# Patient Record
Sex: Female | Born: 1975 | Race: White | Hispanic: No | Marital: Married | Smoking: Never smoker
Health system: Southern US, Community
[De-identification: ages and names within clinical notes are randomized; demographics above are authoritative.]

## PROBLEM LIST (undated history)

## (undated) DIAGNOSIS — E7800 Pure hypercholesterolemia, unspecified: Secondary | ICD-10-CM

## (undated) DIAGNOSIS — F329 Major depressive disorder, single episode, unspecified: Secondary | ICD-10-CM

## (undated) DIAGNOSIS — F3289 Other specified depressive episodes: Secondary | ICD-10-CM

## (undated) DIAGNOSIS — R32 Unspecified urinary incontinence: Secondary | ICD-10-CM

## (undated) DIAGNOSIS — R030 Elevated blood-pressure reading, without diagnosis of hypertension: Secondary | ICD-10-CM

## (undated) DIAGNOSIS — G43009 Migraine without aura, not intractable, without status migrainosus: Secondary | ICD-10-CM

## (undated) DIAGNOSIS — I10 Essential (primary) hypertension: Secondary | ICD-10-CM

## (undated) HISTORY — DX: Unspecified urinary incontinence: R32

## (undated) HISTORY — DX: Migraine without aura, not intractable, without status migrainosus: G43.009

## (undated) HISTORY — PX: ABDOMINAL HYSTERECTOMY: SHX81

## (undated) HISTORY — DX: Other specified depressive episodes: F32.89

## (undated) HISTORY — DX: Major depressive disorder, single episode, unspecified: F32.9

## (undated) HISTORY — DX: Pure hypercholesterolemia, unspecified: E78.00

## (undated) HISTORY — PX: OOPHORECTOMY: SHX86

## (undated) HISTORY — DX: Elevated blood-pressure reading, without diagnosis of hypertension: R03.0

## (undated) HISTORY — PX: TONSILLECTOMY AND ADENOIDECTOMY: SHX28

---

## 2001-05-04 ENCOUNTER — Other Ambulatory Visit: Admission: RE | Admit: 2001-05-04 | Discharge: 2001-05-04 | Payer: Self-pay | Admitting: Obstetrics & Gynecology

## 2001-05-07 ENCOUNTER — Encounter: Payer: Self-pay | Admitting: Obstetrics & Gynecology

## 2001-05-07 ENCOUNTER — Encounter: Admission: RE | Admit: 2001-05-07 | Discharge: 2001-05-07 | Payer: Self-pay | Admitting: Obstetrics & Gynecology

## 2006-05-28 ENCOUNTER — Ambulatory Visit: Payer: Self-pay | Admitting: Unknown Physician Specialty

## 2006-09-15 ENCOUNTER — Inpatient Hospital Stay: Payer: Self-pay | Admitting: Unknown Physician Specialty

## 2009-09-03 ENCOUNTER — Encounter: Payer: Self-pay | Admitting: Family Medicine

## 2010-01-01 LAB — HM PAP SMEAR

## 2010-01-01 LAB — CONVERTED CEMR LAB
Pap Smear: NORMAL
Pap Smear: NORMAL
Pap Smear: NORMAL

## 2010-08-09 ENCOUNTER — Ambulatory Visit: Payer: Self-pay | Admitting: Family Medicine

## 2010-08-09 DIAGNOSIS — G43009 Migraine without aura, not intractable, without status migrainosus: Secondary | ICD-10-CM

## 2010-08-09 DIAGNOSIS — F329 Major depressive disorder, single episode, unspecified: Secondary | ICD-10-CM

## 2010-08-09 DIAGNOSIS — F3289 Other specified depressive episodes: Secondary | ICD-10-CM | POA: Insufficient documentation

## 2010-08-09 DIAGNOSIS — G43109 Migraine with aura, not intractable, without status migrainosus: Secondary | ICD-10-CM | POA: Insufficient documentation

## 2010-08-09 DIAGNOSIS — E7800 Pure hypercholesterolemia, unspecified: Secondary | ICD-10-CM | POA: Insufficient documentation

## 2010-08-09 DIAGNOSIS — R030 Elevated blood-pressure reading, without diagnosis of hypertension: Secondary | ICD-10-CM | POA: Insufficient documentation

## 2010-08-09 DIAGNOSIS — R32 Unspecified urinary incontinence: Secondary | ICD-10-CM | POA: Insufficient documentation

## 2010-08-09 DIAGNOSIS — I10 Essential (primary) hypertension: Secondary | ICD-10-CM | POA: Insufficient documentation

## 2010-08-09 DIAGNOSIS — J4520 Mild intermittent asthma, uncomplicated: Secondary | ICD-10-CM | POA: Insufficient documentation

## 2010-08-09 DIAGNOSIS — K219 Gastro-esophageal reflux disease without esophagitis: Secondary | ICD-10-CM | POA: Insufficient documentation

## 2010-09-11 ENCOUNTER — Encounter: Payer: Self-pay | Admitting: Family Medicine

## 2010-10-10 ENCOUNTER — Ambulatory Visit: Payer: Self-pay | Admitting: Family Medicine

## 2010-10-15 ENCOUNTER — Ambulatory Visit: Payer: Self-pay | Admitting: Family Medicine

## 2010-10-15 DIAGNOSIS — E039 Hypothyroidism, unspecified: Secondary | ICD-10-CM | POA: Insufficient documentation

## 2010-12-03 NOTE — Assessment & Plan Note (Signed)
Summary: URI   Vital Signs:  Patient profile:   34 year old female Height:      67 inches Weight:      154.19 pounds BMI:     24.24 Temp:     98.1 degrees F oral Pulse rate:   64 / minute Pulse rhythm:   regular BP sitting:   120 / 80  (left arm) Cuff size:   regular  Vitals Entered By: Linde Gillis CMA Duncan Dull) (October 10, 2010 2:58 PM) CC: ? URI   History of Present Illness: 35 yo here for URI symptoms.  5 days ago, started with runny nose, dry cough. She is an asthmatic but has not had to use her inhalers. No fevers or chills. Husband had similar symptoms last week and was treated for sinus infection but her sinuses are not bothering her.  Her main complaint is sore throat and dry cough. Taking Mucinex with some relief of symptoms.    Current Medications (verified): 1)  Prilosec 40 Mg Cpdr (Omeprazole) .... One Tablet Daily 2)  Nasonex 50 Mcg/act Susp (Mometasone Furoate) .Marland Kitchen.. 1 Spray Each Nostril Daily 3)  Imitrex 100 Mg Tabs (Sumatriptan Succinate) .... As Needed 4)  Ventolin Hfa 108 (90 Base) Mcg/act Aers (Albuterol Sulfate) .... 2 Puffs Inhaled Every 4-6 Hours As Needed Wheeze 5)  Vivelle-Dot 0.05 Mg/24hr Pttw (Estradiol) .... One Patch Daily 6)  Ketoconazole 2 % Crea (Ketoconazole) .... As Needed 7)  Hydrocortisone 2.5 % Crea (Hydrocortisone) .... As Needed 8)  Multivitamins  Caps (Multiple Vitamin) .... One Tablet Daily  Allergies (verified): No Known Drug Allergies  Past History:  Past Medical History: Last updated: 08/09/2010 Current Problems:  URINARY INCONTINENCE (ICD-788.30) HYPERCHOLESTEROLEMIA (ICD-272.0) ELEVATED BP READING WITHOUT DX HYPERTENSION (ICD-796.2) COMMON MIGRAINE (ICD-346.10) DEPRESSION (ICD-311)    Past Surgical History: Last updated: 08/09/2010 tonsils and adenoids 1993 or 1994 Hysterectomy, total 2007  Family History: Last updated: 08/09/2010 Family History of Arthritis Family History of Colon CA 1st degree relative  <60 Family History Depression Family History High cholesterol Family History Hypertension Family History Kidney disease Family History of Stroke F 1st degree relative <60 Family History of Stroke M 1st degree relative <50 Family History of Sudden Death Family History of Lupus  Mother: depression, HTN, lupus, Sjogren's Father: died from cerebral anyrsym, HTN, GERD only child  Social History: Last updated: 08/09/2010 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 ENT:  Complains of nasal congestion and sore throat; denies sinus pressure. CV:  Denies chest pain or discomfort. Resp:  Complains of cough; denies shortness of breath, sputum productive, and wheezing.  Physical Exam  General:  Well-developed,well-nourished,in no acute distress; alert,appropriate and cooperative throughout examination VSS, nontoxic appearing Nose:  external erythema and mucosal erythema.   sinuses NTTP. Mouth:  pharyngeal erythema.   Neck:  No deformities, masses, or tenderness noted. Lungs:  Normal respiratory effort, chest expands symmetrically. Lungs are clear to auscultation, no crackles or wheezes. Heart:  Normal rate and regular rhythm. S1 and S2 normal without gallop, murmur, click, rub or other extra sounds. Psych:  Cognition and judgment appear intact. Alert and cooperative with normal attention span and concentration. No apparent delusions, illusions, hallucinations   Impression & Recommendations:  Problem # 1:  URI (ICD-465.9)  Assessment New Likely viral. Although she has a h/o asthma, Vital signs and physical exam unremarkable. Advised to continue supportive care as per pt instructions.  Complete Medication List: 1)  Prilosec 40 Mg  Cpdr (Omeprazole) .... One tablet daily 2)  Nasonex 50 Mcg/act Susp (Mometasone furoate) .Marland Kitchen.. 1 spray each nostril daily 3)  Imitrex 100 Mg Tabs (Sumatriptan succinate) .... As needed 4)  Ventolin Hfa 108 (90 Base) Mcg/act Aers (Albuterol sulfate) .... 2 puffs inhaled every 4-6 hours as needed wheeze 5)  Vivelle-dot 0.05 Mg/24hr Pttw (Estradiol) .... One patch daily 6)  Ketoconazole 2 % Crea (Ketoconazole) .... As needed 7)  Hydrocortisone 2.5 % Crea (Hydrocortisone) .... As needed 8)  Multivitamins Caps (Multiple vitamin) .... One tablet daily  Patient Instructions: 1)  Get plenty of rest, drink lots of clear liquids, and use Tylenol or Ibuprofen for fever and comfort. Return in 7-10 days if you're not better: sooner if you'er feeling worse.    Orders Added: 1)  Est. Patient Level III [[address]]    Current Allergies (reviewed today): No known allergies

## 2010-12-03 NOTE — Assessment & Plan Note (Signed)
Summary: NEW PT TO EST/CPX/CLE   Vital Signs:  Patient profile:   35 year old female Height:      67 inches Weight:      155.0 pounds BMI:     24.36 Temp:     97.9 degrees F oral Pulse rate:   80 / minute Pulse rhythm:   regular BP sitting:   100 / 60  (left arm) Cuff size:   regular  Vitals Entered By: Benny Lennert CMA Duncan Dull) (August 09, 2010 10:06 AM)   History of Present Illness: Here to establish..previously seen Dr. Arlana Pouch.  Sees GYN..Dr. Arvil Chaco...had total hysterectomy for endometriosis, bleeding in ovaries...concerning for early ovarian cancer. HAd precancerous cells on cervix..cryotherapy.   Asthma and allergic rhinitis well controlled on nasonex.Rarely uses proventil..needs refill it is expired.  Migraine..1 every 3 months..knows triggers and can avoid.  Uses imitrex as needed.   GERD, well controlled on omeprazole.Marland KitchenMarland KitchenShe has lost 100 over last 2 years. Open to trying to stop.   Elevvated Bps in past when was obese...but resolved with eweight loss. On no medicaiton now.   High chol improved with weight loss as well. Last check at health [facility]. Has upcoming health [facility] in 09/2010  Preventive Screening-Counseling & Management  Alcohol-Tobacco     Smoking Status: never  Caffeine-Diet-Exercise     Does Patient Exercise: yes      Drug Use:  no.    Allergies (verified): No Known Drug Allergies  Past History:  Past Medical History: Current Problems:  URINARY INCONTINENCE (ICD-788.30) HYPERCHOLESTEROLEMIA (ICD-272.0) ELEVATED BP READING WITHOUT DX HYPERTENSION (ICD-796.2) COMMON MIGRAINE (ICD-346.10) DEPRESSION (ICD-311)    Past Surgical History: tonsils and adenoids 1993 or 1994 Hysterectomy, total 2007  Family History: Family History of Arthritis Family History of Colon CA 1st degree relative <60 Family History Depression Family History High cholesterol Family History Hypertension Family History Kidney disease Family History of Stroke F 1st  degree relative <60 Family History of Stroke M 1st degree relative <50 Family History of Sudden Death Family History of Lupus  Mother: depression, HTN, lupus, Sjogren's Father: died from cerebral anyrsym, HTN, GERD only child  Social History: 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 CV:  Denies chest pain or discomfort. Resp:  Denies shortness of breath. GI:  Complains of abdominal pain; denies bloody stools, constipation, and diarrhea; intermittant right lower abdominal pain.Marland KitchenGYN did [place]..nml..thought likely due to adhesions...nml bowel habits. GU:  Denies dysuria.   Impression & Recommendations:  Problem # 1:  GASTROESOPHAGEAL REFLUX DISEASE (ICD-530.81) Gradually wean off omeprazole...go down to 20 mg prilosec x several weeks then every pother day, then every 3 days then off.  Her updated medication list for this problem includes:    Prilosec 40 Mg Cpdr (Omeprazole) ..... One tablet daily  Problem # 2:  ASTHMA, INTERMITTENT, MILD (ICD-493.90) Well controlle don allergy meds, no controller. Her updated medication list for this problem includes:    Ventolin Hfa 108 (90 Base) Mcg/act Aers (Albuterol sulfate) .Marland Kitchen... 2 puffs inhaled every 4-6 hours as needed wheeze  Problem # 3:  ELEVATED BP READING WITHOUT DX HYPERTENSION (ICD-796.2) Resolved with 100 lb weight loss. BP today: 100/60  Instructed in low sodium diet (DASH Handout) and behavior modification.    Problem # 4:  HYPERCHOLESTEROLEMIA (ICD-272.0) She will fax [place] lab results from [facility] in 09/2010.  Complete Medication List: 1)  Prilosec 40 Mg Cpdr (Omeprazole) .... One tablet daily 2)  Nasonex 50 Mcg/act Susp (Mometasone furoate) .Marland Kitchen.. 1 spray each nostril daily 3)   Imitrex 100 Mg Tabs (Sumatriptan succinate) .... As needed 4)  Ventolin Hfa 108 (90 Base) Mcg/act Aers (Albuterol sulfate) .... 2 puffs inhaled every 4-6 hours as needed wheeze 5)  Vivelle-dot 0.05 Mg/24hr Pttw (Estradiol) .... One patch daily 6)  Ketoconazole 2 % Crea (Ketoconazole) .... As needed 7)  Hydrocortisone 2.5 % Crea (Hydrocortisone) .... As needed 8)  Multivitamins Caps (Multiple vitamin) .... One tablet daily  Other Orders: Tdap => 51yrs IM [phone]) Admin 1st Vaccine (52841) Admin 1st Vaccine [facility]) [phone])  Patient Instructions: 1)  Gradually wean off omeprazole...go down to 20 mg prilosec x several weeks then every pother day, then every 3 days then off.  2)  Fax results of labs from [facility]. 3)  Please schedule a follow-up appointment in 1 year or earlier if needed.  Prescriptions: NASONEX 50 MCG/ACT SUSP (MOMETASONE FUROATE) 1 spray each nostril daily  #30 x 11   Entered and Authorized by:   Kerby Nora MD   Signed by:   Kerby Nora MD on 08/09/2010   Method used:   Electronically to        [id number] (ret[facility])       [address]       [place], [place]  [address]       Ph: [phone]       Fax: [phone]   RxID:   [id number] VENTOLIN HFA 108 (90 BASE) MCG/ACT AERS (ALBUTEROL SULFATE) 2 puffs inhaled every 4-6 hours as needed wheeze  #1 x 0   Entered and Authorized by:   Kerby Nora MD   Signed by:   Kerby Nora MD on 08/09/2010   Method used:   Electronically to        [id number] (ret[facility])       [address]       [place], [place]  [address]       Ph: [phone]       Fax: [phone]   RxID:   [id number]   Current Allergies (reviewed today): No known allergies   Flu Vaccine Result Date:  09/03/2010 Flu Vaccine Result:  at work Flu Vaccine Next Due:  1 yr TD Result Date:  08/09/2010 TD Result:  given TD Next Due:  10 yr PAP Result Date:  01/01/2010 PAP Result:  normal PAP Next Due:  1  yr    Tetanus/Td Vaccine    Vaccine Type: Tdap    Site: left deltoid    Mfr: GlaxoSmithKline    Dose: 0.5 ml    Given by: Benny Lennert CMA (AAMA)    Exp. Date: 08/22/2012    Lot #: [id number]    VIS given: 09/20/08 version given August 09, 2010.

## 2010-12-05 NOTE — Assessment & Plan Note (Signed)
Summary: DISCUSS THYROID,REFLUX/CLE   Vital Signs:  Patient profile:   35 year old female Height:      67 inches Weight:      154.50 pounds BMI:     24.29 Temp:     97.8 degrees F oral Pulse rate:   72 / minute Pulse rhythm:   regular BP sitting:   100 / 60  (left arm) Cuff size:   regular  Vitals Entered By: Benny Lennert CMA Duncan Dull) (October 15, 2010 10:47 AM)  History of Present Illness: Chief complaint discuss thyroid and reflux  GERD: Was unable to stop prilosec entirely... symptoms controlled on 20 mg of prilosec daily. Has had some recent cough and mucus production x 1 week... saw Dr.a ron for viral infection.  Hoarse at night.  Had recetn elevated TSH but normal T4...subclinical hypothyroidism.  Hands and feet are very cold x 6 months, hair brittle breaking a lot...noted in last 3 months. Fairly good energy.. some decrease.  Problems Prior to Update: 1)  Uri  (ICD-465.9) 2)  Dermatitis, Seborrheic  (ICD-690.10) 3)  Gastroesophageal Reflux Disease  (ICD-530.81) 4)  Asthma, Intermittent, Mild  (ICD-493.90) 5)  Family History Depression  (ICD-V17.0) 6)  Family History of Colon Ca 1st Degree Relative <60  (ICD-V16.0) 7)  Urinary Incontinence  (ICD-788.30) 8)  Hypercholesterolemia  (ICD-272.0) 9)  Elevated Bp Reading Without Dx Hypertension  (ICD-796.2) 10)  Common Migraine  (ICD-346.10) 11)  Hx of Depression  (ICD-311)  Current Medications (verified): 1)  Prilosec 20 Mg Cpdr (Omeprazole) .... One Tablet Daily 2)  Nasonex 50 Mcg/act Susp (Mometasone Furoate) .Marland Kitchen.. 1 Spray Each Nostril Daily 3)  Imitrex 100 Mg Tabs (Sumatriptan Succinate) .... As Needed 4)  Ventolin Hfa 108 (90 Base) Mcg/act Aers (Albuterol Sulfate) .... 2 Puffs Inhaled Every 4-6 Hours As Needed Wheeze 5)  Vivelle-Dot 0.05 Mg/24hr Pttw (Estradiol) .... One Patch Daily 6)  Ketoconazole 2 % Crea (Ketoconazole) .... As Needed 7)  Hydrocortisone 2.5 % Crea (Hydrocortisone) .... As Needed 8)   Multivitamins  Caps (Multiple Vitamin) .... One Tablet Daily  Allergies (verified): No Known Drug Allergies  Past History:  Past medical, surgical, family and social histories (including risk factors) reviewed, and no changes noted (except as noted below).  Past Medical History: Reviewed history from 08/09/2010 and no changes required. Current Problems:  URINARY INCONTINENCE (ICD-788.30) HYPERCHOLESTEROLEMIA (ICD-272.0) ELEVATED BP READING WITHOUT DX HYPERTENSION (ICD-796.2) COMMON MIGRAINE (ICD-346.10) DEPRESSION (ICD-311)    Past Surgical History: Reviewed history from 08/09/2010 and no changes required. tonsils and adenoids 1993 or 1994 Hysterectomy, total 2007  Family History: Reviewed history from 08/09/2010 and no changes required. Family History of Arthritis Family History of Colon CA 1st degree relative <60 Family History Depression Family History High cholesterol Family History Hypertension Family History Kidney disease Family History of Stroke F 1st degree relative <60 Family History of Stroke M 1st degree relative <50 Family History of Sudden Death Family History of Lupus  Mother: depression, HTN, lupus, Sjogren's Father: died from cerebral anyrsym, HTN, GERD only child  Social History: Reviewed history from 08/09/2010 and no changes required. 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 CV:  Denies chest pain or discomfort. Resp:  Denies shortness of breath. GI:  Denies abdominal pain and constipation. GU:  Denies dysuria.  Physical Exam  General:  Well-developed,well-nourished,in no acute distress; alert,appropriate and  cooperative throughout examination Eyes:  No corneal or conjunctival inflammation noted. EOMI. Perrla. Funduscopic exam benign, without hemorrhages, exudates or  papilledema. Vision grossly normal. Ears:  External ear exam shows no significant lesions or deformities.  Otoscopic examination reveals clear canals, tympanic membranes are intact bilaterally without bulging, retraction, inflammation or discharge. Hearing is grossly normal bilaterally. Nose:  External nasal examination shows no deformity or inflammation. Nasal mucosa are pink and moist without lesions or exudates. Mouth:  Oral mucosa and oropharynx without lesions or exudates.  Teeth in good repair. Neck:  no carotid bruit or thyromegaly no cervical or supraclavicular lymphadenopathy  Lungs:  Normal respiratory effort, chest expands symmetrically. Lungs are clear to auscultation, no crackles or wheezes. Heart:  Normal rate and regular rhythm. S1 and S2 normal without gallop, murmur, click, rub or other extra sounds. Abdomen:  Bowel sounds positive,abdomen soft and non-tender without masses, organomegaly or hernias noted. Pulses:  R and L carotid,radial,femoral,dorsalis pedis and posterior tibial pulses are full and equal bilaterally Extremities:  no edema    Impression & Recommendations:  Problem # 1:  HYPOTHYROIDISM, BORDERLINE (ICD-244.9) Follow up closer than previously recommended given pt concer, family history and symptoms. Recheck in 4-5 months.   Problem # 2:  GASTROESOPHAGEAL REFLUX DISEASE (ICD-530.81) if ? viral symptoms not resolving...trial of prilosec back at 40 mg daily to determine if GERD is cause of symptoms.  Her updated medication list for this problem includes    Prilosec 20 Mg Cpdr (Omeprazole) ..... One tablet daily  Complete Medication List: 1)  Prilosec 20 Mg Cpdr (Omeprazole) .... One tablet daily 2)  Nasonex 50 Mcg/act Susp (Mometasone furoate) .Marland Kitchen.. 1 spray each nostril daily 3)  Imitrex 100 Mg Tabs (Sumatriptan succinate) .... As needed 4)  Ventolin Hfa 108 (90 Base) Mcg/act Aers (Albuterol sulfate) .... 2 puffs inhaled every 4-6 hours as needed wheeze 5)   Vivelle-dot 0.05 Mg/24hr Pttw (Estradiol) .... One patch daily 6)  Ketoconazole 2 % Crea (Ketoconazole) .... As needed 7)  Hydrocortisone 2.5 % Crea (Hydrocortisone) .... As needed 8)  Multivitamins Caps (Multiple vitamin) .... One tablet daily  Patient Instructions: 1)  If cough or hoarseness not improving with viral infection.Marland Kitchen try increasing for several days back to 40 mg daily. 2)  Return for free T3 and free  T4 , TSH Dx 780/79 in 4-5 months. 3)  Call sooner if fatigue or other symptoms worsening.    Orders Added: 1)  Est. Patient Level IV [[address]]    Current Allergies (reviewed today): No known allergies

## 2011-02-12 ENCOUNTER — Other Ambulatory Visit: Payer: Self-pay | Admitting: Family Medicine

## 2011-02-12 DIAGNOSIS — R5381 Other malaise: Secondary | ICD-10-CM

## 2011-02-12 DIAGNOSIS — R5383 Other fatigue: Secondary | ICD-10-CM

## 2011-02-17 ENCOUNTER — Other Ambulatory Visit (INDEPENDENT_AMBULATORY_CARE_PROVIDER_SITE_OTHER): Payer: 59 | Admitting: Family Medicine

## 2011-02-17 DIAGNOSIS — R5381 Other malaise: Secondary | ICD-10-CM

## 2011-02-17 DIAGNOSIS — R5383 Other fatigue: Secondary | ICD-10-CM

## 2011-02-17 LAB — T4, FREE: Free T4: 0.85 ng/dL (ref 0.60–1.60)

## 2011-02-17 LAB — T3, FREE: T3, Free: 2.8 pg/mL (ref 2.3–4.2)

## 2011-02-17 LAB — TSH: TSH: 4.54 u[IU]/mL (ref 0.35–5.50)

## 2011-03-13 ENCOUNTER — Other Ambulatory Visit: Payer: Self-pay | Admitting: Family Medicine

## 2011-06-30 ENCOUNTER — Telehealth: Payer: Self-pay | Admitting: *Deleted

## 2011-06-30 NOTE — Telephone Encounter (Signed)
Copy of labs from 4/16 mailed to patient's home address per her request.

## 2011-07-04 ENCOUNTER — Other Ambulatory Visit: Payer: Self-pay | Admitting: Family Medicine

## 2011-08-04 ENCOUNTER — Other Ambulatory Visit: Payer: Self-pay | Admitting: Family Medicine

## 2011-11-03 ENCOUNTER — Other Ambulatory Visit: Payer: Self-pay | Admitting: Family Medicine

## 2011-12-18 ENCOUNTER — Ambulatory Visit: Payer: 59 | Admitting: Family Medicine

## 2011-12-18 ENCOUNTER — Encounter: Payer: Self-pay | Admitting: Family Medicine

## 2011-12-18 ENCOUNTER — Ambulatory Visit (INDEPENDENT_AMBULATORY_CARE_PROVIDER_SITE_OTHER): Payer: 59 | Admitting: Family Medicine

## 2011-12-18 VITALS — BP 90/60 | HR 58 | Temp 99.0°F | Ht 67.0 in | Wt 173.1 lb

## 2011-12-18 DIAGNOSIS — H9209 Otalgia, unspecified ear: Secondary | ICD-10-CM

## 2011-12-18 DIAGNOSIS — H6690 Otitis media, unspecified, unspecified ear: Secondary | ICD-10-CM

## 2011-12-18 MED ORDER — FLUCONAZOLE 150 MG PO TABS: ORAL_TABLET | ORAL | Status: DC

## 2011-12-18 MED ORDER — AMOXICILLIN 500 MG PO CAPS: 1000.0000 mg | ORAL_CAPSULE | Freq: Two times a day (BID) | ORAL | Status: AC

## 2011-12-18 MED ORDER — ANTIPYRINE-BENZOCAINE 5.4-1.4 % OT SOLN: 3.0000 [drp] | OTIC | Status: AC | PRN

## 2011-12-18 NOTE — Progress Notes (Signed)
  Patient Name: Mary Keith Date of Birth: [birth year] Age: 36 y.o. Medical Record Number: [id number] Gender: female Date of Encounter: 12/18/2011  History of Present Illness:  Mary Keith is a 36 y.o. very pleasant female patient who presents with the following:  Left ear is really stopped up. Uncomfortable and a different sensation and pain in the posterior and on the inferior. Four weeks ago, went to alergy and asthma with a severe sinus infection. Tooksome predniosne and some ABX.  Ear was not bothering her then.   Past Medical History, Surgical History, Social History, Family History, Problem List, Medications, and Allergies have been reviewed and updated if relevant.  Review of Systems: ROS: GEN: Acute illness details above GI: Tolerating PO intake GU: maintaining adequate hydration and urination Pulm: No SOB Interactive and getting along well at home.  Otherwise, ROS is as per the HPI.   Physical Examination: Filed Vitals:   12/18/11 1034  BP: 90/60  Pulse: 58  Temp: 99 F (37.2 C)  TempSrc: Oral  Height: 5\' 7"  (1.702 m)  Weight: 173 lb 1.9 oz (78.527 kg)  SpO2: 100%    Body mass index is 27.11 kg/(m^2).   Gen: WDWN, NAD; A & O x3, cooperative. Pleasant.Globally Non-toxic HEENT: Normocephalic and atraumatic. Throat clear, w/o exudate, R TM clear, L TM - landmarks obscured, reddened. Some dulled light reflex. rhinnorhea.  MMM Frontal sinuses: NT Max sinuses: NT NECK: Anterior cervical  LAD is absent CV: RRR, No M/G/R, cap refill <2 sec PULM: Breathing comfortably in no respiratory distress. no wheezing, crackles, rhonchi EXT: No c/c/e PSYCH: Friendly, good eye contact MSK: Nml gait    Assessment and Plan: 1. Otitis media  amoxicillin (AMOXIL) 500 MG capsule, fluconazole (DIFLUCAN) 150 MG tablet, antipyrine-benzocaine (AURALGAN) otic solution  2. Ear pain      Auralgan now abx  Diflucan if needed

## 2012-01-24 ENCOUNTER — Other Ambulatory Visit: Payer: Self-pay | Admitting: Family Medicine

## 2012-02-25 ENCOUNTER — Other Ambulatory Visit: Payer: Self-pay | Admitting: Family Medicine

## 2012-03-18 ENCOUNTER — Ambulatory Visit: Payer: Self-pay | Admitting: Unknown Physician Specialty

## 2012-03-24 ENCOUNTER — Other Ambulatory Visit: Payer: Self-pay | Admitting: Family Medicine

## 2012-06-28 ENCOUNTER — Encounter: Payer: Self-pay | Admitting: Family Medicine

## 2012-06-28 ENCOUNTER — Ambulatory Visit (INDEPENDENT_AMBULATORY_CARE_PROVIDER_SITE_OTHER): Payer: 59 | Admitting: Family Medicine

## 2012-06-28 VITALS — BP 118/80 | Temp 98.3°F | Wt 177.0 lb

## 2012-06-28 DIAGNOSIS — R279 Unspecified lack of coordination: Secondary | ICD-10-CM

## 2012-06-28 DIAGNOSIS — M24819 Other specific joint derangements of unspecified shoulder, not elsewhere classified: Secondary | ICD-10-CM

## 2012-06-28 DIAGNOSIS — M24119 Other articular cartilage disorders, unspecified shoulder: Secondary | ICD-10-CM

## 2012-06-28 DIAGNOSIS — G2589 Other specified extrapyramidal and movement disorders: Secondary | ICD-10-CM

## 2012-06-28 DIAGNOSIS — M948X9 Other specified disorders of cartilage, unspecified sites: Secondary | ICD-10-CM

## 2012-06-28 NOTE — Patient Instructions (Addendum)
[email or web address] ALL SHOULDER VIDEOS AND WORKOUT DO 3-4 TIMES A WEEK   REFERRAL: GO THE THE FRONT ROOM AT THE ENTRANCE OF OUR CLINIC, NEAR CHECK IN. ASK FOR MARION. SHE WILL HELP YOU SET UP YOUR REFERRAL. DATE: TIME:    1. Snapping scapula syndrome   2. Scapular dyskinesis

## 2012-06-28 NOTE — Progress Notes (Signed)
[facility] at [facility] [address] [place] [address] Phone: [phone] Fax: [phone]  Date:  06/28/2012   Name:  Mary Keith   DOB:  01-Aug-1976   MRN:  [id number] Gender: female Age: 36 y.o.  PCP:  Kerby Nora, MD    Chief Complaint: left shoulder pain   History of Present Illness:  Mary Keith is a 36 y.o. very pleasant female patient who presents with the following:  Left posterior shoulder, pain and worse and worse and feels like stabbing a a knife under her shoulder blade. Nothing aggravates it. Nothing helps it more. Alleve will help a little  No pain with abduction, no neck pain, no radiculopathy, no tingling.  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  Current Outpatient Prescriptions on File Prior to Visit  Medication Sig Dispense Refill  . albuterol (PROVENTIL HFA;VENTOLIN HFA) 108 (90 BASE) MCG/ACT inhaler Inhale 2 puffs into the lungs every 6 (six) hours as needed.      Marland Kitchen NASONEX 50 MCG/ACT nasal spray INSTILL TWO SPRAYS IN EACH NOSTRIL EVERY DAY  51 g  1  . SUMAtriptan (IMITREX) 100 MG tablet TAKE 1 TABLET BY MOUTH AS NEEDED FOR MIGRAINE  9 tablet  2    Review of Systems:  GEN: No fevers, chills. Nontoxic. Primarily MSK c/o today. MSK: Detailed in the HPI GI: tolerating PO intake without difficulty Neuro: No numbness, parasthesias, or tingling associated. Otherwise the pertinent positives of the ROS are noted above.    Physical Examination: Filed Vitals:   06/28/12 1553  BP: 118/80  Temp: 98.3 F (36.8 C)   Filed Vitals:   06/28/12 1553  Weight: 177 lb (80.287 kg)   There is no height on file to calculate BMI. Ideal Body Weight:     GEN: WDWN, NAD, Non-toxic, Alert & Oriented x 3 HEENT: Atraumatic, Normocephalic.  Ears and Nose: No  external deformity. EXTR: No clubbing/cyanosis/edema NEURO: Normal gait.  PSYCH: Normally interactive. Conversant. Not depressed or anxious appearing.  Calm demeanor.   Shoulder: L Inspection: No muscle wasting or winging Ecchymosis/edema: neg  AC joint, scapula, clavicle:  + SNAPPING ON L WITH MOTION Cervical spine: NT, full ROM Spurling's: neg Abduction: full, 5/5 Flexion: full, 5/5 IR, full, lift-off: 5/5 ER at neutral: full, 5/5 AC crossover and compression: neg Neer: neg Hawkins: neg Drop Test: neg Empty Can: neg Supraspinatus insertion: NT Bicipital groove: NT Speed's: neg Yergason's: neg Sulcus sign: neg Scapular dyskinesis: MILD FLARING WITH PUSHUP C5-T1 intact Sensation intact Grip 5/5   Assessment and Plan:  1. Snapping scapula syndrome  Ambulatory referral to Physical Therapy  2. Scapular dyskinesis  Ambulatory referral to Physical Therapy   >15 minutes spent in face to face time with patient, >50% spent in counselling or coordination of care  Rehab reviewed, video rehab reviewed in office  Orders Today:  Orders Placed This Encounter  Procedures  . Ambulatory referral to Physical Therapy    Referral Priority:  Routine    Referral Type:  Physical Medicine    Referral Reason:  Specialty Services Required    Requested Specialty:  Physical Therapy    Number of Visits Requested:  1    Medications Today: (Includes new updates added during medication reconciliation) Meds ordered this encounter  Medications  . estradiol (VIVELLE-DOT) 0.05 MG/24HR  Sig: Place 1 patch onto the skin 2 (two) times a week.    Medications Discontinued: Medications Discontinued During This Encounter  Medication Reason  . fluconazole (DIFLUCAN) 150 MG tablet Therapy completed  . estradiol (VIVELLE-DOT) 0.0375 MG/24HR Change in therapy     Hannah Beat, MD

## 2012-09-02 LAB — LIPID PANEL: HDL: 76 mg/dL — AB (ref 35–70)

## 2012-10-12 ENCOUNTER — Ambulatory Visit (INDEPENDENT_AMBULATORY_CARE_PROVIDER_SITE_OTHER): Payer: BC Managed Care – PPO | Admitting: Family Medicine

## 2012-10-12 ENCOUNTER — Encounter: Payer: Self-pay | Admitting: Family Medicine

## 2012-10-12 ENCOUNTER — Ambulatory Visit: Payer: Self-pay | Admitting: Unknown Physician Specialty

## 2012-10-12 VITALS — BP 110/60 | HR 64 | Temp 98.6°F | Ht 67.0 in | Wt 181.0 lb

## 2012-10-12 DIAGNOSIS — J45909 Unspecified asthma, uncomplicated: Secondary | ICD-10-CM

## 2012-10-12 DIAGNOSIS — K219 Gastro-esophageal reflux disease without esophagitis: Secondary | ICD-10-CM

## 2012-10-12 DIAGNOSIS — G43009 Migraine without aura, not intractable, without status migrainosus: Secondary | ICD-10-CM

## 2012-10-12 DIAGNOSIS — R030 Elevated blood-pressure reading, without diagnosis of hypertension: Secondary | ICD-10-CM

## 2012-10-12 DIAGNOSIS — E039 Hypothyroidism, unspecified: Secondary | ICD-10-CM

## 2012-10-12 DIAGNOSIS — E7800 Pure hypercholesterolemia, unspecified: Secondary | ICD-10-CM

## 2012-10-12 MED ORDER — MOMETASONE FUROATE 50 MCG/ACT NA SUSP: 2.0000 | Freq: Every day | NASAL | Status: DC

## 2012-10-12 MED ORDER — OMEPRAZOLE 40 MG PO CPDR: 40.0000 mg | DELAYED_RELEASE_CAPSULE | Freq: Every day | ORAL | Status: DC

## 2012-10-12 MED ORDER — SUMATRIPTAN SUCCINATE 50 MG PO TABS: 50.0000 mg | ORAL_TABLET | ORAL | Status: DC | PRN

## 2012-10-12 NOTE — Patient Instructions (Signed)
Get back on track with diet weight loss and exercise. Follow up in 1 year.

## 2012-10-12 NOTE — Assessment & Plan Note (Signed)
Refilled nasonex. Use albuterol prn.

## 2012-10-12 NOTE — Assessment & Plan Note (Signed)
Well controlled. Continue current medication.  

## 2012-10-12 NOTE — Progress Notes (Signed)
  Subjective:    Patient ID: Mary Keith, female    DOB: 01/20/1976, 36 y.o.   MRN: [id number]  HPI   36 year old female presents for annual follow up of chronic medical issues.  Elevated Cholesterol: Done at [facility] LDL: 101, HDl: 76,tri 85 Using medications without problems:Not on a  Med. Muscle aches:  Diet compliance: Poor, has gained weight Exercise:None Other complaints:  Hypothyroid, borderline in past.. TSH 10/13: 3.540 on  NO med.   GERD: Well controlled on omeprazole 40 mg daily.  Asthma, mild intermittant: Only used albuterol 2-3 times in last year.. Only with allergies in spring. Nasonex work swell.  Migraine: Occuring one a month to every other month. Using imitrex prn. She has had more stress lately.  in past she has had less SE on 50 mg dose.    Sees GYN: Has had total hysterectomy.  Review of Systems  Constitutional: Negative for fever and fatigue.  HENT: Negative for ear pain.   Eyes: Negative for pain.  Respiratory: Negative for chest tightness and shortness of breath.   Cardiovascular: Negative for chest pain, palpitations and leg swelling.  Gastrointestinal: Negative for abdominal pain.  Genitourinary: Negative for dysuria.       Objective:   Physical Exam  Constitutional: Vital signs are normal. She appears well-developed and well-nourished. She is cooperative.  Non-toxic appearance. She does not appear ill. No distress.  HENT:  Head: Normocephalic.  Right Ear: Hearing, tympanic membrane, external ear and ear canal normal. Tympanic membrane is not erythematous, not retracted and not bulging.  Left Ear: Hearing, tympanic membrane, external ear and ear canal normal. Tympanic membrane is not erythematous, not retracted and not bulging.  Nose: No mucosal edema or rhinorrhea. Right sinus exhibits no maxillary sinus tenderness and no frontal sinus tenderness. Left sinus exhibits no maxillary sinus tenderness and no frontal sinus tenderness.   Mouth/Throat: Uvula is midline, oropharynx is clear and moist and mucous membranes are normal.  Eyes: Conjunctivae normal, EOM and lids are normal. Pupils are equal, round, and reactive to light. No foreign bodies found.  Neck: Trachea normal and normal range of motion. Neck supple. Carotid bruit is not present. No mass and no thyromegaly present.  Cardiovascular: Normal rate, regular rhythm, S1 normal, S2 normal, normal heart sounds, intact distal pulses and normal pulses.  Exam reveals no gallop and no friction rub.   No murmur heard. Pulmonary/Chest: Effort normal and breath sounds normal. Not tachypneic. No respiratory distress. She has no decreased breath sounds. She has no wheezes. She has no rhonchi. She has no rales.  Abdominal: Soft. Normal appearance and bowel sounds are normal. There is no tenderness.  Neurological: She is alert.  Skin: Skin is warm, dry and intact. No rash noted.  Psychiatric: Her speech is normal and behavior is normal. Judgment and thought content normal. Her mood appears not anxious. Cognition and memory are normal. She does not exhibit a depressed mood.          Assessment & Plan:

## 2012-10-12 NOTE — Assessment & Plan Note (Signed)
Stable TSH

## 2012-10-12 NOTE — Assessment & Plan Note (Signed)
Well controlled on no med. Encouraged exercise, weight loss, healthy eating habits.   

## 2012-10-12 NOTE — Assessment & Plan Note (Signed)
Some increase in frequency but not in range with prophylaxsis needed. Will try lower dose of imitrex to avoid SE, conseled pt to lie down to sleep after taking med for 30 min. Discussed healthy sleep and lifestyle.

## 2012-10-12 NOTE — Assessment & Plan Note (Signed)
Resolved

## 2012-10-29 ENCOUNTER — Encounter: Payer: Self-pay | Admitting: Family Medicine

## 2013-02-16 ENCOUNTER — Other Ambulatory Visit: Payer: Self-pay | Admitting: Family Medicine

## 2013-04-06 ENCOUNTER — Ambulatory Visit: Payer: Self-pay | Admitting: Unknown Physician Specialty

## 2013-06-07 ENCOUNTER — Telehealth: Payer: Self-pay

## 2013-06-07 MED ORDER — VALACYCLOVIR HCL 1 G PO TABS: 2000.0000 mg | ORAL_TABLET | Freq: Two times a day (BID) | ORAL | Status: DC

## 2013-06-07 NOTE — Telephone Encounter (Signed)
Spoke with patient and advised results   

## 2013-06-07 NOTE — Telephone Encounter (Signed)
Notify pt that she can use valacylovir for acute treatment each time blister flares up. If she continues to have  a lot of fever blisters.. We can put her on daily low dose preventative valacyclovir.

## 2013-06-07 NOTE — Telephone Encounter (Signed)
Pt left v/m pt has recently had 4 fever blisters in last 5 weeks; pt has had fever blisters on and off and request med sent to [facility]. Pt has used [employer] which is not effective.Please advise.

## 2013-07-07 ENCOUNTER — Other Ambulatory Visit: Payer: Self-pay | Admitting: Family Medicine

## 2013-09-21 ENCOUNTER — Other Ambulatory Visit: Payer: Self-pay | Admitting: Family Medicine

## 2013-09-27 ENCOUNTER — Other Ambulatory Visit: Payer: Self-pay | Admitting: Family Medicine

## 2013-09-28 NOTE — Telephone Encounter (Signed)
Last office visit 10/12/2012.  Not on medication list.  Ok to refill?

## 2013-10-14 ENCOUNTER — Other Ambulatory Visit: Payer: Self-pay | Admitting: Family Medicine

## 2013-10-21 ENCOUNTER — Ambulatory Visit (INDEPENDENT_AMBULATORY_CARE_PROVIDER_SITE_OTHER): Payer: BC Managed Care – PPO | Admitting: Family Medicine

## 2013-10-21 ENCOUNTER — Encounter: Payer: Self-pay | Admitting: Family Medicine

## 2013-10-21 VITALS — BP 120/84 | HR 64 | Temp 98.4°F | Ht 67.0 in | Wt 185.0 lb

## 2013-10-21 DIAGNOSIS — B009 Herpesviral infection, unspecified: Secondary | ICD-10-CM | POA: Insufficient documentation

## 2013-10-21 DIAGNOSIS — A088 Other specified intestinal infections: Secondary | ICD-10-CM

## 2013-10-21 DIAGNOSIS — A084 Viral intestinal infection, unspecified: Secondary | ICD-10-CM | POA: Insufficient documentation

## 2013-10-21 MED ORDER — PROMETHAZINE HCL 25 MG PO TABS: 25.0000 mg | ORAL_TABLET | Freq: Three times a day (TID) | ORAL | Status: DC | PRN

## 2013-10-21 NOTE — Assessment & Plan Note (Signed)
Given frequent occurrence.. Will check immune system with cbc when over viral GE.

## 2013-10-21 NOTE — Progress Notes (Signed)
   Subjective:    Patient ID: Mary Keith, female    DOB: 1976-07-05, 37 y.o.   MRN: [id number]  HPI  37 year old female presents with  6 days of diarrhea, started feeling tired and ill in last 4 days.  She has had severe nausea but off and on vomiting. No fever. Some abdominal cramping. No blood in stool. No dysuria. Yesterday and today  she has kept down liquids,  Today she has kept down some bland food.  No sick contacts.  She has fever blisters but has now started having 2-3 each month... Valacyclovir helps them stay small but they still recur.    Review of Systems  Constitutional: Negative for fever and fatigue.  HENT: Negative for ear pain.   Eyes: Negative for pain.  Respiratory: Negative for chest tightness and shortness of breath.   Cardiovascular: Negative for chest pain, palpitations and leg swelling.  Gastrointestinal: Negative for abdominal pain.  Genitourinary: Negative for dysuria.       Objective:   Physical Exam  Constitutional: Vital signs are normal. She appears well-developed and well-nourished. She is cooperative.  Non-toxic appearance. She does not appear ill. No distress.  HENT:  Head: Normocephalic.  Right Ear: Hearing, tympanic membrane, external ear and ear canal normal. Tympanic membrane is not erythematous, not retracted and not bulging.  Left Ear: Hearing, tympanic membrane, external ear and ear canal normal. Tympanic membrane is not erythematous, not retracted and not bulging.  Nose: No mucosal edema or rhinorrhea. Right sinus exhibits no maxillary sinus tenderness and no frontal sinus tenderness. Left sinus exhibits no maxillary sinus tenderness and no frontal sinus tenderness.  Mouth/Throat: Uvula is midline, oropharynx is clear and moist and mucous membranes are normal.  Eyes: Conjunctivae, EOM and lids are normal. Pupils are equal, round, and reactive to light. Lids are everted and swept, no foreign bodies found.  Neck: Trachea normal and  normal range of motion. Neck supple. Carotid bruit is not present. No mass and no thyromegaly present.  Cardiovascular: Normal rate, regular rhythm, S1 normal, S2 normal, normal heart sounds, intact distal pulses and normal pulses.  Exam reveals no gallop and no friction rub.   No murmur heard. Pulmonary/Chest: Effort normal and breath sounds normal. Not tachypneic. No respiratory distress. She has no decreased breath sounds. She has no wheezes. She has no rhonchi. She has no rales.  Abdominal: Soft. Normal appearance and bowel sounds are normal. There is no tenderness.  Neurological: She is alert.  Skin: Skin is warm, dry and intact. No rash noted.  Psychiatric: Her speech is normal and behavior is normal. Judgment and thought content normal. Her mood appears not anxious. Cognition and memory are normal. She does not exhibit a depressed mood.          Assessment & Plan:

## 2013-10-21 NOTE — Patient Instructions (Addendum)
Can use phenergan for nausea as needed. Push fluids. Go to ER if not keeping down any liquid in 12 hours, or no urine output.  When you are feeling better make lab appt for cbc with diff to cehck your immune system.

## 2013-10-21 NOTE — Assessment & Plan Note (Signed)
Symptomatic care.  Phenergan for nause.  No current dehydration.

## 2013-10-21 NOTE — Progress Notes (Signed)
Pre-visit discussion using our clinic review tool. No additional management support is needed unless otherwise documented below in the visit note.  

## 2013-11-04 ENCOUNTER — Encounter: Payer: Self-pay | Admitting: *Deleted

## 2013-11-04 ENCOUNTER — Other Ambulatory Visit (INDEPENDENT_AMBULATORY_CARE_PROVIDER_SITE_OTHER): Payer: BC Managed Care – PPO

## 2013-11-04 DIAGNOSIS — B009 Herpesviral infection, unspecified: Secondary | ICD-10-CM

## 2013-11-04 LAB — CBC WITH DIFFERENTIAL/PLATELET
BASOS PCT: 0.7 % (ref 0.0–3.0)
Basophils Absolute: 0 10*3/uL (ref 0.0–0.1)
EOS ABS: 0.2 10*3/uL (ref 0.0–0.7)
Eosinophils Relative: 3.1 % (ref 0.0–5.0)
HEMATOCRIT: 41 % (ref 36.0–46.0)
HEMOGLOBIN: 13.9 g/dL (ref 12.0–15.0)
LYMPHS PCT: 30.2 % (ref 12.0–46.0)
Lymphs Abs: 2.1 10*3/uL (ref 0.7–4.0)
MCHC: 33.9 g/dL (ref 30.0–36.0)
MCV: 92.7 fl (ref 78.0–100.0)
Monocytes Absolute: 0.6 10*3/uL (ref 0.1–1.0)
Monocytes Relative: 7.9 % (ref 3.0–12.0)
NEUTROS ABS: 4.1 10*3/uL (ref 1.4–7.7)
Neutrophils Relative %: 58.1 % (ref 43.0–77.0)
Platelets: 279 10*3/uL (ref 150.0–400.0)
RBC: 4.42 Mil/uL (ref 3.87–5.11)
RDW: 13 % (ref 11.5–14.6)
WBC: 7.1 10*3/uL (ref 4.5–10.5)

## 2013-12-06 ENCOUNTER — Ambulatory Visit (INDEPENDENT_AMBULATORY_CARE_PROVIDER_SITE_OTHER)
Admission: RE | Admit: 2013-12-06 | Discharge: 2013-12-06 | Disposition: A | Discharge status: Home / Self Care | Payer: BC Managed Care – PPO | Source: Ambulatory Visit | Attending: Family Medicine | Admitting: Family Medicine

## 2013-12-06 ENCOUNTER — Encounter: Payer: Self-pay | Admitting: Family Medicine

## 2013-12-06 ENCOUNTER — Ambulatory Visit (INDEPENDENT_AMBULATORY_CARE_PROVIDER_SITE_OTHER): Payer: BC Managed Care – PPO | Admitting: Family Medicine

## 2013-12-06 VITALS — BP 118/74 | HR 62 | Temp 98.0°F | Ht 67.0 in | Wt 191.2 lb

## 2013-12-06 DIAGNOSIS — M79671 Pain in right foot: Secondary | ICD-10-CM

## 2013-12-06 DIAGNOSIS — M79609 Pain in unspecified limb: Secondary | ICD-10-CM

## 2013-12-06 MED ORDER — MELOXICAM 15 MG PO TABS: 15.0000 mg | ORAL_TABLET | Freq: Every day | ORAL | Status: DC

## 2013-12-06 NOTE — Progress Notes (Signed)
   Subjective:    Patient ID: Mary Keith, female    DOB: 05/30/1976, 38 y.o.   MRN: [id number]  HPI  38 year old female presents with new onset pain at base of second toe ongoing x 1 onth. She has stopped exercising given walking hurts.  No redness, no swelling. No fall, no injury.  No pain at rest... Pain is with push off of gait.  She has hx of toe surgery at second toe in 1990s... She had a bony growth removed from PIP.  Ibuprofen 800 mg helps .Marland Kitchen. Has taken at times.  Review of Systems  Constitutional: Negative for fever and fatigue.  HENT: Negative for ear pain.   Eyes: Negative for pain.  Respiratory: Negative for chest tightness and shortness of breath.   Cardiovascular: Negative for chest pain, palpitations and leg swelling.  Gastrointestinal: Negative for abdominal pain.  Genitourinary: Negative for dysuria.       Objective:   Physical Exam  Constitutional: Vital signs are normal. She appears well-developed and well-nourished. She is cooperative.  Non-toxic appearance. She does not appear ill. No distress.  HENT:  Head: Normocephalic.  Right Ear: Hearing, tympanic membrane, external ear and ear canal normal. Tympanic membrane is not erythematous, not retracted and not bulging.  Left Ear: Hearing, tympanic membrane, external ear and ear canal normal. Tympanic membrane is not erythematous, not retracted and not bulging.  Nose: No mucosal edema or rhinorrhea. Right sinus exhibits no maxillary sinus tenderness and no frontal sinus tenderness. Left sinus exhibits no maxillary sinus tenderness and no frontal sinus tenderness.  Mouth/Throat: Uvula is midline, oropharynx is clear and moist and mucous membranes are normal.  Eyes: Conjunctivae, EOM and lids are normal. Pupils are equal, round, and reactive to light. Lids are everted and swept, no foreign bodies found.  Neck: Trachea normal and normal range of motion. Neck supple. Carotid bruit is not present. No mass and no  thyromegaly present.  Cardiovascular: Normal rate, regular rhythm, S1 normal, S2 normal, normal heart sounds, intact distal pulses and normal pulses.  Exam reveals no gallop and no friction rub.   No murmur heard. Pulmonary/Chest: Effort normal and breath sounds normal. Not tachypneic. No respiratory distress. She has no decreased breath sounds. She has no wheezes. She has no rhonchi. She has no rales.  Abdominal: Soft. Normal appearance and bowel sounds are normal. There is no tenderness.  Musculoskeletal:       Right ankle: Normal.       Left ankle: Normal.       Right foot: She exhibits decreased range of motion and bony tenderness. She exhibits no tenderness and no swelling.       Left foot: Normal.  ttp over MCP, phalngangeal joint... Not tender over metatarsal heads.  No mass palpated.  Neurological: She is alert.  Skin: Skin is warm, dry and intact. No rash noted.  Psychiatric: Her speech is normal and behavior is normal. Judgment and thought content normal. Her mood appears not anxious. Cognition and memory are normal. She does not exhibit a depressed mood.          Assessment & Plan:

## 2013-12-06 NOTE — Patient Instructions (Signed)
Start antiinflammatory meloxicam daily. We will call with X-ray results. Follow up in 2 weeks if no better.

## 2013-12-06 NOTE — Assessment & Plan Note (Signed)
?   Traumatic arthritis from past surgery vs stress fracture or other injury. Eval with X-ray, treat with NSAID. If not improving consider referral to podiatry.

## 2013-12-06 NOTE — Progress Notes (Signed)
Pre-visit discussion using our clinic review tool. No additional management support is needed unless otherwise documented below in the visit note.  

## 2013-12-22 ENCOUNTER — Other Ambulatory Visit: Payer: Self-pay | Admitting: Family Medicine

## 2014-01-25 ENCOUNTER — Other Ambulatory Visit: Payer: Self-pay | Admitting: Family Medicine

## 2014-03-08 ENCOUNTER — Encounter: Payer: Self-pay | Admitting: Family Medicine

## 2014-03-08 ENCOUNTER — Ambulatory Visit (INDEPENDENT_AMBULATORY_CARE_PROVIDER_SITE_OTHER): Payer: BC Managed Care – PPO | Admitting: Family Medicine

## 2014-03-08 VITALS — BP 122/68 | HR 61 | Temp 98.1°F | Ht 67.0 in | Wt 196.5 lb

## 2014-03-08 DIAGNOSIS — E7800 Pure hypercholesterolemia, unspecified: Secondary | ICD-10-CM

## 2014-03-08 DIAGNOSIS — Z0000 Encounter for general adult medical examination without abnormal findings: Secondary | ICD-10-CM

## 2014-03-08 DIAGNOSIS — E039 Hypothyroidism, unspecified: Secondary | ICD-10-CM

## 2014-03-08 NOTE — Patient Instructions (Signed)
Return for fasting labs in next few days to weeks.  Work on [employer], regular exercise and weight loss. Call to schedule mammogram on own. Look into MMR titer coverage

## 2014-03-08 NOTE — Progress Notes (Signed)
38 year old female here for annual wellness exam and preventative care.    Elevated Cholesterol: Due  Diet compliance: Poor, has gained weight  Exercise 2 days a week. Other complaints:  Wt Readings from Last 3 Encounters:  03/08/14 196 lb 8 oz (89.132 kg)  12/06/13 191 lb 4 oz (86.75 kg)  10/21/13 185 lb (83.915 kg)    Hypothyroid, borderline in past..Due  GERD: Well controlled on omeprazole 40 mg daily.   Asthma, mild intermittant:She has not used albuterol in last year. Only with allergies in spring. Nasonex works well.   Migraine: Occuring one a month to every other month. Can usually determine the trigger. Work on [employer]uary[employer] avoidance. Using imitrex prn  In past she had seen GYN: Has had total hysterectomy for cysts on ovaries. She has not ahd any recent abnormal pap. She wishes to have CPX here.  Review of Systems  Constitutional: Negative for fever and fatigue.  HENT: Negative for ear pain.  Eyes: Negative for pain.  Respiratory: Negative for chest tightness and shortness of breath.  Cardiovascular: Negative for chest pain, palpitations and leg swelling.  Gastrointestinal: Negative for abdominal pain.  Genitourinary: Negative for dysuria.  Objective:   Physical Exam  Constitutional: Vital signs are normal. She appears well-developed and well-nourished. She is cooperative. Non-toxic appearance. She does not appear ill. No distress.  HENT:  Head: Normocephalic.  Right Ear: Hearing, tympanic membrane, external ear and ear canal normal. Tympanic membrane is not erythematous, not retracted and not bulging.  Left Ear: Hearing, tympanic membrane, external ear and ear canal normal. Tympanic membrane is not erythematous, not retracted and not bulging.  Nose: No mucosal edema or rhinorrhea. Right sinus exhibits no maxillary sinus tenderness and no frontal sinus tenderness. Left sinus exhibits no maxillary sinus tenderness and no frontal sinus tenderness.  Mouth/Throat: Uvula is  midline, oropharynx is clear and moist and mucous membranes are normal.  Eyes: Conjunctivae normal, EOM and lids are normal. Pupils are equal, round, and reactive to light. No foreign bodies found.  Neck: Trachea normal and normal range of motion. Neck supple. Carotid bruit is not present. No mass and no thyromegaly present.  Cardiovascular: Normal rate, regular rhythm, S1 normal, S2 normal, normal heart sounds, intact distal pulses and normal pulses. Exam reveals no gallop and no friction rub.  No murmur heard.  Pulmonary/Chest: Effort normal and breath sounds normal. Not tachypneic. No respiratory distress. She has no decreased breath sounds. She has no wheezes. She has no rhonchi. She has no rales.  Abdominal: Soft. Normal appearance and bowel sounds are normal. There is no tenderness.  Neurological: She is alert.  Skin: Skin is warm, dry and intact. No rash noted.  Psychiatric: Her speech is normal and behavior is normal. Judgment and thought content normal. Her mood appears not anxious. Cognition and memory are normal. She does not exhibit a depressed mood.   Breast exam: no masses, no nipple or skin changes bilateral breasts   A/P:   The patient's preventative maintenance and recommended screening tests for an annual wellness exam were reviewed in full today. Brought up to date unless services declined.  Counselled on the importance of diet, exercise, and its role in overall health and mortality. The patient's FH and SH was reviewed, including their home life, tobacco status, and drug and alcohol status.    Mammogram: She has dense breasts... She is due for yearly screening. PAP/ DVE: TAH, no vaginal symptoms. No early family history colon cancer except PGF  late 5850s. Vaccines: Tdap Uptodate Nonsmoker

## 2014-03-08 NOTE — Progress Notes (Signed)
Pre visit review using our clinic review tool, if applicable. No additional management support is needed unless otherwise documented below in the visit note. 

## 2014-03-10 ENCOUNTER — Other Ambulatory Visit: Payer: Self-pay | Admitting: Family Medicine

## 2014-03-13 ENCOUNTER — Other Ambulatory Visit (INDEPENDENT_AMBULATORY_CARE_PROVIDER_SITE_OTHER): Payer: BC Managed Care – PPO

## 2014-03-13 ENCOUNTER — Encounter: Payer: Self-pay | Admitting: Family Medicine

## 2014-03-13 DIAGNOSIS — R030 Elevated blood-pressure reading, without diagnosis of hypertension: Secondary | ICD-10-CM

## 2014-03-13 DIAGNOSIS — E039 Hypothyroidism, unspecified: Secondary | ICD-10-CM

## 2014-03-13 DIAGNOSIS — E7800 Pure hypercholesterolemia, unspecified: Secondary | ICD-10-CM

## 2014-03-13 LAB — COMPREHENSIVE METABOLIC PANEL
ALK PHOS: 47 U/L (ref 39–117)
ALT: 35 U/L (ref 0–35)
AST: 35 U/L (ref 0–37)
Albumin: 4 g/dL (ref 3.5–5.2)
BUN: 13 mg/dL (ref 6–23)
CALCIUM: 9.4 mg/dL (ref 8.4–10.5)
CHLORIDE: 100 meq/L (ref 96–112)
CO2: 29 mEq/L (ref 19–32)
CREATININE: 0.9 mg/dL (ref 0.4–1.2)
GFR: 71.72 mL/min (ref >60.00)
Glucose, Bld: 87 mg/dL (ref 70–99)
Potassium: 4.7 mEq/L (ref 3.5–5.1)
Sodium: 135 mEq/L (ref 135–145)
Total Bilirubin: 0.7 mg/dL (ref 0.2–1.2)
Total Protein: 7.1 g/dL (ref 6.0–8.3)

## 2014-03-13 LAB — LIPID PANEL
Cholesterol: 177 mg/dL (ref 0–200)
HDL: 66.3 mg/dL (ref >39.00)
LDL CALC: 94 mg/dL (ref 0–99)
TRIGLYCERIDES: 86 mg/dL (ref 0.0–149.0)
Total CHOL/HDL Ratio: 3
VLDL: 17.2 mg/dL (ref 0.0–40.0)

## 2014-03-13 LAB — TSH: TSH: 3.23 u[IU]/mL (ref 0.35–4.50)

## 2014-03-13 MED ORDER — ESTRADIOL 0.05 MG/24HR TD PTTW: 1.0000 | MEDICATED_PATCH | TRANSDERMAL | Status: DC

## 2014-04-04 ENCOUNTER — Ambulatory Visit (INDEPENDENT_AMBULATORY_CARE_PROVIDER_SITE_OTHER): Payer: BC Managed Care – PPO | Admitting: Family Medicine

## 2014-04-04 ENCOUNTER — Encounter: Payer: Self-pay | Admitting: Family Medicine

## 2014-04-04 VITALS — BP 100/74 | HR 74 | Temp 98.4°F | Ht 67.0 in | Wt 196.5 lb

## 2014-04-04 DIAGNOSIS — H6990 Unspecified Eustachian tube disorder, unspecified ear: Secondary | ICD-10-CM

## 2014-04-04 DIAGNOSIS — IMO0002 Reserved for concepts with insufficient information to code with codable children: Secondary | ICD-10-CM

## 2014-04-04 DIAGNOSIS — H6982 Other specified disorders of Eustachian tube, left ear: Secondary | ICD-10-CM | POA: Insufficient documentation

## 2014-04-04 DIAGNOSIS — H6992 Unspecified Eustachian tube disorder, left ear: Secondary | ICD-10-CM | POA: Insufficient documentation

## 2014-04-04 DIAGNOSIS — H6980 Other specified disorders of Eustachian tube, unspecified ear: Secondary | ICD-10-CM

## 2014-04-04 DIAGNOSIS — M5416 Radiculopathy, lumbar region: Secondary | ICD-10-CM | POA: Insufficient documentation

## 2014-04-04 MED ORDER — PREDNISONE 20 MG PO TABS: 60.0000 mg | ORAL_TABLET | Freq: Every day | ORAL | Status: DC

## 2014-04-04 NOTE — Progress Notes (Signed)
Subjective:    Patient ID: Mary Keith, female    DOB: [birth year], 38 y.o.   MRN: [id number]  Back Pain Pertinent negatives include no abdominal pain, chest pain, dysuria or fever.    38 year old female presents with new onset  right low back pain x 2 months. Worse in last 2 weeks. Radiates down to right buttock and upper thigh. Occ pain around to right lateral hip. Pain is worse with sitting and some bending. No numbness, no weakness.  No fever. No perineal numbness. No recent fall or injury.    She has history of chronic mid low back pain, this is different.  Went for massage, helped minimally. She has tried Tens unit which helps. She has tried meloxicam for pain.. No relief.  Crackyling in right ear. Pain last week.  No current pain or pressure in ear. No runny nose, no URI sym[ptoms.  Some recent allergies. No decrease in hearing.  Suing nasonex 1 spray per day.   Review of Systems  Constitutional: Negative for fever and fatigue.  HENT: Negative for ear pain.   Eyes: Negative for pain.  Respiratory: Negative for chest tightness and shortness of breath.   Cardiovascular: Negative for chest pain, palpitations and leg swelling.  Gastrointestinal: Negative for abdominal pain.  Genitourinary: Negative for dysuria.  Musculoskeletal: Positive for back pain.       Objective:   Physical Exam  Constitutional: She is oriented to person, place, and time. Vital signs are normal. She appears well-developed and well-nourished. She is cooperative.  Non-toxic appearance. She does not appear ill. No distress.  HENT:  Head: Normocephalic.  Right Ear: Hearing, external ear and ear canal normal. Tympanic membrane is not erythematous, not retracted and not bulging. A middle ear effusion is present.  Left Ear: Hearing, tympanic membrane, external ear and ear canal normal. Tympanic membrane is not erythematous, not retracted and not bulging.  No middle ear effusion.  Nose: No mucosal  edema or rhinorrhea. Right sinus exhibits no maxillary sinus tenderness and no frontal sinus tenderness. Left sinus exhibits no maxillary sinus tenderness and no frontal sinus tenderness.  Mouth/Throat: Uvula is midline, oropharynx is clear and moist and mucous membranes are normal.  Eyes: Conjunctivae, EOM and lids are normal. Pupils are equal, round, and reactive to light. Lids are everted and swept, no foreign bodies found.  Neck: Trachea normal and normal range of motion. Neck supple. Carotid bruit is not present. No mass and no thyromegaly present.  Cardiovascular: Normal rate, regular rhythm, S1 normal, S2 normal, normal heart sounds, intact distal pulses and normal pulses.  Exam reveals no gallop and no friction rub.   No murmur heard. Pulmonary/Chest: Effort normal and breath sounds normal. Not tachypneic. No respiratory distress. She has no decreased breath sounds. She has no wheezes. She has no rhonchi. She has no rales.  Abdominal: Soft. Normal appearance and bowel sounds are normal. There is no tenderness.  Musculoskeletal:       Lumbar back: She exhibits decreased range of motion and tenderness. She exhibits no bony tenderness.  Positive SLR on left  Neurological: She is alert and oriented to person, place, and time. She has normal strength and normal reflexes. No cranial nerve deficit or sensory deficit. She exhibits normal muscle tone. She displays a negative Romberg sign. Coordination and gait normal. GCS eye subscore is 4. GCS verbal subscore is 5. GCS motor subscore is 6.  Skin: Skin is warm, dry and intact. No rash noted.  Psychiatric: She has a normal mood and affect. Her speech is normal and behavior is normal. Judgment and thought content normal. Her mood appears not anxious. Cognition and memory are normal. Cognition and memory are not impaired. She does not exhibit a depressed mood. She exhibits normal recent memory and normal remote memory.          Assessment & Plan:

## 2014-04-04 NOTE — Progress Notes (Signed)
Pre visit review using our clinic review tool, if applicable. No additional management support is needed unless otherwise documented below in the visit note. 

## 2014-04-04 NOTE — Patient Instructions (Addendum)
Start prednisone course. Stop at front desk for PT referral. Heat on area. Follow up if not improving in 2 week. Call sooner if severe pain. Can increase nasonex 2 sprays daily.

## 2014-04-04 NOTE — Assessment & Plan Note (Signed)
INcrease nasal steroid spray to 2 sprays per nostril daily.

## 2014-04-04 NOTE — Assessment & Plan Note (Addendum)
Heat, prednisone and PT.  If not improving consider X-ray eval.

## 2014-04-07 ENCOUNTER — Ambulatory Visit: Payer: Self-pay | Admitting: Family Medicine

## 2014-04-10 ENCOUNTER — Encounter: Payer: Self-pay | Admitting: Family Medicine

## 2014-06-05 ENCOUNTER — Ambulatory Visit: Payer: Self-pay | Admitting: Podiatry

## 2014-06-12 ENCOUNTER — Ambulatory Visit: Payer: Self-pay | Admitting: Podiatry

## 2014-07-11 ENCOUNTER — Telehealth: Payer: Self-pay

## 2014-07-11 MED ORDER — SUMATRIPTAN SUCCINATE 100 MG PO TABS: ORAL_TABLET | ORAL | Status: DC

## 2014-07-11 NOTE — Telephone Encounter (Signed)
Pt left v/m; pt has migraine today that started at 4 AM and pt has taken imitrex 50 mg x 2 this AM without relief from h/a; pt has taken phenergan 25 mg for N&V. Pt said previously she has taken imitrex  and request new rx of [facility]rex 100 mg to [facility]. Please advise.

## 2014-07-11 NOTE — Telephone Encounter (Signed)
Mary Keith notified as instructed by telephone.

## 2014-07-11 NOTE — Telephone Encounter (Signed)
Sent higher dose for imitrex in to pharmacy.  Can repeat again today. No more than 200 mg imitrex in 24 hours. She can take ibuprofen 800 mg along withit as long as it has not irritated her stomach in past. She may also repeat phenergan every 6 hours as needed. Take all this, drink a lot of fluids  and go to sleep.  Let us known if headache is not resolving.

## 2014-09-05 ENCOUNTER — Other Ambulatory Visit: Payer: Self-pay | Admitting: *Deleted

## 2014-09-05 MED ORDER — OMEPRAZOLE 40 MG PO CPDR: DELAYED_RELEASE_CAPSULE | ORAL | Status: DC

## 2014-12-29 ENCOUNTER — Other Ambulatory Visit: Payer: Self-pay | Admitting: Family Medicine

## 2014-12-29 NOTE — Telephone Encounter (Signed)
Last office visit 04/04/2014.  Last refilled 09/27/2013 for #4 with 3 refills.  Ok to refill?

## 2015-02-07 ENCOUNTER — Other Ambulatory Visit: Payer: Self-pay | Admitting: Family Medicine

## 2015-03-01 ENCOUNTER — Other Ambulatory Visit: Payer: Self-pay

## 2015-03-01 DIAGNOSIS — Z1231 Encounter for screening mammogram for malignant neoplasm of breast: Secondary | ICD-10-CM

## 2015-03-02 ENCOUNTER — Other Ambulatory Visit: Payer: Self-pay | Admitting: Family Medicine

## 2015-03-06 ENCOUNTER — Other Ambulatory Visit: Payer: Self-pay | Admitting: Family Medicine

## 2015-03-12 ENCOUNTER — Telehealth: Payer: Self-pay | Admitting: Family Medicine

## 2015-03-12 DIAGNOSIS — E7800 Pure hypercholesterolemia, unspecified: Secondary | ICD-10-CM

## 2015-03-12 DIAGNOSIS — E038 Other specified hypothyroidism: Secondary | ICD-10-CM

## 2015-03-12 NOTE — Telephone Encounter (Signed)
-----   Message from Alvina Chouerri J Walsh sent at 03/08/2015 10:58 AM EDT ----- Regarding: Lab orders for Wednesday, 5.11.16 Patient is scheduled for CPX labs, please order future labs, Thanks , Camelia Engerri

## 2015-03-14 ENCOUNTER — Other Ambulatory Visit (INDEPENDENT_AMBULATORY_CARE_PROVIDER_SITE_OTHER): Payer: BLUE CROSS/BLUE SHIELD

## 2015-03-14 DIAGNOSIS — E038 Other specified hypothyroidism: Secondary | ICD-10-CM | POA: Diagnosis not present

## 2015-03-14 DIAGNOSIS — E7800 Pure hypercholesterolemia, unspecified: Secondary | ICD-10-CM

## 2015-03-14 LAB — LIPID PANEL
CHOL/HDL RATIO: 3
Cholesterol: 174 mg/dL (ref 0–200)
HDL: 63.3 mg/dL (ref >39.00)
LDL Cholesterol: 99 mg/dL (ref 0–99)
NonHDL: 110.7
TRIGLYCERIDES: 58 mg/dL (ref 0.0–149.0)
VLDL: 11.6 mg/dL (ref 0.0–40.0)

## 2015-03-14 LAB — COMPREHENSIVE METABOLIC PANEL
ALT: 17 U/L (ref 0–35)
AST: 22 U/L (ref 0–37)
Albumin: 4.1 g/dL (ref 3.5–5.2)
Alkaline Phosphatase: 53 U/L (ref 39–117)
BILIRUBIN TOTAL: 0.5 mg/dL (ref 0.2–1.2)
BUN: 13 mg/dL (ref 6–23)
CO2: 29 meq/L (ref 19–32)
CREATININE: 0.93 mg/dL (ref 0.40–1.20)
Calcium: 9.7 mg/dL (ref 8.4–10.5)
Chloride: 98 mEq/L (ref 96–112)
GFR: 71.34 mL/min (ref >60.00)
Glucose, Bld: 94 mg/dL (ref 70–99)
Potassium: 4.5 mEq/L (ref 3.5–5.1)
Sodium: 132 mEq/L — ABNORMAL LOW (ref 135–145)
Total Protein: 6.9 g/dL (ref 6.0–8.3)

## 2015-03-14 LAB — TSH: TSH: 4.61 u[IU]/mL — AB (ref 0.35–4.50)

## 2015-03-16 ENCOUNTER — Encounter: Payer: Self-pay | Admitting: Family Medicine

## 2015-03-16 ENCOUNTER — Ambulatory Visit (INDEPENDENT_AMBULATORY_CARE_PROVIDER_SITE_OTHER): Payer: BLUE CROSS/BLUE SHIELD | Admitting: Family Medicine

## 2015-03-16 VITALS — BP 111/78 | HR 66 | Temp 98.3°F | Ht 66.5 in | Wt 187.0 lb

## 2015-03-16 DIAGNOSIS — G43009 Migraine without aura, not intractable, without status migrainosus: Secondary | ICD-10-CM

## 2015-03-16 DIAGNOSIS — Z0000 Encounter for general adult medical examination without abnormal findings: Secondary | ICD-10-CM

## 2015-03-16 DIAGNOSIS — E7800 Pure hypercholesterolemia, unspecified: Secondary | ICD-10-CM

## 2015-03-16 DIAGNOSIS — E038 Other specified hypothyroidism: Secondary | ICD-10-CM

## 2015-03-16 DIAGNOSIS — E871 Hypo-osmolality and hyponatremia: Secondary | ICD-10-CM | POA: Insufficient documentation

## 2015-03-16 DIAGNOSIS — R030 Elevated blood-pressure reading, without diagnosis of hypertension: Secondary | ICD-10-CM

## 2015-03-16 DIAGNOSIS — N6320 Unspecified lump in the left breast, unspecified quadrant: Secondary | ICD-10-CM

## 2015-03-16 MED ORDER — SUMATRIPTAN SUCCINATE 50 MG PO TABS: 50.0000 mg | ORAL_TABLET | ORAL | Status: DC | PRN

## 2015-03-16 MED ORDER — OMEPRAZOLE 20 MG PO CPDR: 20.0000 mg | DELAYED_RELEASE_CAPSULE | Freq: Every day | ORAL | Status: DC

## 2015-03-16 NOTE — Patient Instructions (Addendum)
Decrease water intake some by about 32 oz. No salt restriction.  Return for sodium and thyroid check in 1-2 week.  Stop at front desk to set up mammogram and left breast US.

## 2015-03-16 NOTE — Assessment & Plan Note (Signed)
Well controlled. Continue current medication.  

## 2015-03-16 NOTE — Assessment & Plan Note (Signed)
Likely due to excessive water intake. Decrease and re-check.

## 2015-03-16 NOTE — Progress Notes (Signed)
Pre visit review using our clinic review tool, if applicable. No additional management support is needed unless otherwise documented below in the visit note. 

## 2015-03-16 NOTE — Assessment & Plan Note (Signed)
Borderline high. Will re-eval with free t3 and free t4.  Consider treatment to help with fatigue and weight issues.

## 2015-03-16 NOTE — Progress Notes (Signed)
39 year old female presents for wellness visit.  Overall doing well..  Drinking a gallon of water per day. On salt restricted diet. Hyponatremia.   Elevated Cholesterol:  LDL at goal  < 160 on no medication. Lab Results  Component Value Date   CHOL 174 03/14/2015   HDL 63.30 03/14/2015   LDLCALC 99 03/14/2015   TRIG 58.0 03/14/2015   CHOLHDL 3 03/14/2015  Using medications without problems:None Muscle aches:  Diet compliance: Good. Exercise: 4-5 days a week. Other complaints: Wt Readings from Last 3 Encounters:  03/16/15 187 lb (84.823 kg)  04/04/14 196 lb 8 oz (89.132 kg)  03/08/14 196 lb 8 oz (89.132 kg)  Body mass index is 29.73 kg/(m^2).   Hypothyroid, borderline in past..  Today slightly low. Lab Results  Component Value Date   TSH 4.61* 03/14/2015  She does have some fatigue  GERD: Well controlled on omeprazole 40 mg daily.   Interested in weaning off.  Asthma, mild intermittant:She has not used albuterol in last year.  Allergies well controlled this year. Nasonex 1 spray daily works well.   No nighttime cough.  Migraine: Occuring one a month to every other month. Can usually determine the trigger. Work on Actuarytrigger avoidance. Using imitrex prn  In past she had seen GYN: Has had total hysterectomy for cysts on ovaries. She has not ahd any recent abnormal pap. She wishes to have CPX here.  Social History /Family History/Past Medical History reviewed and updated if needed.   Review of Systems  Constitutional: Negative for fever and fatigue.  HENT: Negative for ear pain.  Eyes: Negative for pain.  Respiratory: Negative for chest tightness and shortness of breath.  Cardiovascular: Negative for chest pain, palpitations and leg swelling.  Gastrointestinal: Negative for abdominal pain.  Genitourinary: Negative for dysuria.  Objective:   Physical Exam  Constitutional: Vital signs are normal. She appears well-developed and well-nourished. She is  cooperative. Non-toxic appearance. She does not appear ill. No distress.  HENT:  Head: Normocephalic.  Right Ear: Hearing, tympanic membrane, external ear and ear canal normal. Tympanic membrane is not erythematous, not retracted and not bulging.  Left Ear: Hearing, tympanic membrane, external ear and ear canal normal. Tympanic membrane is not erythematous, not retracted and not bulging.  Nose: No mucosal edema or rhinorrhea. Right sinus exhibits no maxillary sinus tenderness and no frontal sinus tenderness. Left sinus exhibits no maxillary sinus tenderness and no frontal sinus tenderness.  Mouth/Throat: Uvula is midline, oropharynx is clear and moist and mucous membranes are normal.  Eyes: Conjunctivae normal, EOM and lids are normal. Pupils are equal, round, and reactive to light. No foreign bodies found.  Neck: Trachea normal and normal range of motion. Neck supple. Carotid bruit is not present. No mass and no thyromegaly present.  Cardiovascular: Normal rate, regular rhythm, S1 normal, S2 normal, normal heart sounds, intact distal pulses and normal pulses. Exam reveals no gallop and no friction rub.  No murmur heard.  Pulmonary/Chest: Effort normal and breath sounds normal. Not tachypneic. No respiratory distress. She has no decreased breath sounds. She has no wheezes. She has no rhonchi. She has no rales.  Abdominal: Soft. Normal appearance and bowel sounds are normal. There is no tenderness.  Neurological: She is alert.  Skin: Skin is warm, dry and intact. No rash noted.  Psychiatric: Her speech is normal and behavior is normal. Judgment and thought content normal. Her mood appears not anxious. Cognition and memory are normal. She does not exhibit a  depressed mood.   Breast exam: 0.5 cm prominent thickening/mass in left breast at 7 o'clock, no nipple or skin changes bilateral breasts. Both breast have significant fibrocystic changes.   A/P:  The patient's preventative  maintenance and recommended screening tests for an annual wellness exam were reviewed in full today. Brought up to date unless services declined.  Counselled on the importance of diet, exercise, and its role in overall health and mortality. The patient's FH and SH was reviewed, including their home life, tobacco status, and drug and alcohol status.    Mammogram: She has dense breasts... nml mammo 04/2014  She has noted small nodule in left lower breast, comes and goes in last 2 months. No pain, no skin changes, 2 episodes of nipple discharge with expression (white discharge) in last 2 months.Marland Kitchen. PAP/ DVE: TAH, no vaginal symptoms. No early family history colon cancer except PGF late 3150s. Vaccines: Tdap Uptodate Nonsmoker HIV: refused

## 2015-03-16 NOTE — Addendum Note (Signed)
Addended by: Kerby NoraBEDSOLE, Braden Deloach E on: 03/16/2015 05:35 PM   Modules accepted: Orders, SmartSet

## 2015-03-28 ENCOUNTER — Ambulatory Visit
Admission: RE | Admit: 2015-03-28 | Discharge: 2015-03-28 | Disposition: A | Discharge status: Home / Self Care | Payer: BLUE CROSS/BLUE SHIELD | Source: Ambulatory Visit | Attending: Family Medicine | Admitting: Family Medicine

## 2015-03-28 ENCOUNTER — Other Ambulatory Visit: Payer: Self-pay | Admitting: Family Medicine

## 2015-03-28 ENCOUNTER — Ambulatory Visit: Payer: BLUE CROSS/BLUE SHIELD

## 2015-03-28 DIAGNOSIS — N6320 Unspecified lump in the left breast, unspecified quadrant: Secondary | ICD-10-CM

## 2015-03-28 DIAGNOSIS — R928 Other abnormal and inconclusive findings on diagnostic imaging of breast: Secondary | ICD-10-CM | POA: Diagnosis not present

## 2015-03-28 DIAGNOSIS — N63 Unspecified lump in breast: Secondary | ICD-10-CM | POA: Diagnosis present

## 2015-03-29 ENCOUNTER — Other Ambulatory Visit (INDEPENDENT_AMBULATORY_CARE_PROVIDER_SITE_OTHER): Payer: BLUE CROSS/BLUE SHIELD

## 2015-03-29 DIAGNOSIS — E038 Other specified hypothyroidism: Secondary | ICD-10-CM

## 2015-03-29 DIAGNOSIS — E871 Hypo-osmolality and hyponatremia: Secondary | ICD-10-CM | POA: Diagnosis not present

## 2015-03-29 LAB — T4, FREE: Free T4: 0.78 ng/dL (ref 0.60–1.60)

## 2015-03-29 LAB — TSH: TSH: 4.34 u[IU]/mL (ref 0.35–4.50)

## 2015-03-29 LAB — BASIC METABOLIC PANEL
BUN: 14 mg/dL (ref 6–23)
CO2: 26 mEq/L (ref 19–32)
Calcium: 9.5 mg/dL (ref 8.4–10.5)
Chloride: 103 mEq/L (ref 96–112)
Creatinine, Ser: 0.97 mg/dL (ref 0.40–1.20)
GFR: 67.94 mL/min (ref >60.00)
Glucose, Bld: 91 mg/dL (ref 70–99)
Potassium: 4.6 mEq/L (ref 3.5–5.1)
Sodium: 136 mEq/L (ref 135–145)

## 2015-03-29 LAB — T3, FREE: T3, Free: 3 pg/mL (ref 2.3–4.2)

## 2015-05-01 ENCOUNTER — Other Ambulatory Visit: Payer: Self-pay | Admitting: Family Medicine

## 2015-06-08 ENCOUNTER — Ambulatory Visit (INDEPENDENT_AMBULATORY_CARE_PROVIDER_SITE_OTHER): Payer: BLUE CROSS/BLUE SHIELD | Admitting: Family Medicine

## 2015-06-08 ENCOUNTER — Encounter: Payer: Self-pay | Admitting: Family Medicine

## 2015-06-08 VITALS — BP 110/72 | HR 69 | Temp 98.3°F | Ht 66.5 in | Wt 190.5 lb

## 2015-06-08 DIAGNOSIS — H65112 Acute and subacute allergic otitis media (mucoid) (sanguinous) (serous), left ear: Secondary | ICD-10-CM | POA: Diagnosis not present

## 2015-06-08 DIAGNOSIS — H6980 Other specified disorders of Eustachian tube, unspecified ear: Secondary | ICD-10-CM | POA: Diagnosis not present

## 2015-06-08 MED ORDER — AMOXICILLIN-POT CLAVULANATE 875-125 MG PO TABS: 1.0000 | ORAL_TABLET | Freq: Two times a day (BID) | ORAL | Status: DC

## 2015-06-08 NOTE — Progress Notes (Signed)
Dr. Karleen Hampshire T. Anneta Rounds, MD, CAQ Sports Medicine [facility] [address] [place], [address] Phone: [phone] Fax: [phone]  06/08/2015  Patient: Mary Keith, MRN: [id number], DOB: May 22, 1976, 39 y.o.  Primary Physician:  Kerby Nora, MD  Chief Complaint: Ear Pain; Nasal Congestion; and Dizziness  Subjective:   Mary Keith is a 39 y.o. very pleasant female patient who presents with the following:  Micah Flesher out of town last week and ear started bothering her. Sunday was hurting really bad and had an ear infection and started to feel a little bit better. Feels pretty congested. Feels congested.   This was all on the left common she was given some Augmentin.  He was feeling a little bit better, that her symptoms started to come back.  Past Medical History, Surgical History, Social History, Family History, Problem List, Medications, and Allergies have been reviewed and updated if relevant.  Patient Active Problem List   Diagnosis Date Noted  . Breast mass, left 03/16/2015  . Hyponatremia 03/16/2015  . 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 Hypothyroidism 10/15/2010  . HYPERCHOLESTEROLEMIA 08/09/2010  . Migraine without aura 08/09/2010  . ASTHMA, INTERMITTENT, MILD 08/09/2010  . GASTROESOPHAGEAL REFLUX DISEASE 08/09/2010  . URINARY INCONTINENCE 08/09/2010  . ELEVATED BP READING WITHOUT DX HYPERTENSION 08/09/2010    Past Medical History  Diagnosis Date  . Unspecified urinary incontinence   . Pure hypercholesterolemia   . Elevated blood pressure reading without diagnosis of hypertension   . Migraine without aura, without mention of intractable migraine without mention of status migrainosus   . Depressive disorder, not elsewhere classified     Past Surgical History  Procedure Laterality Date  . Tonsillectomy and adenoidectomy    . Abdominal hysterectomy      History   Social History  . Marital Status: Married    Spouse Name:  N/A  . Number of Children: N/A  . Years of Education: N/A   Occupational History  . Immunologist town [employer]    Social History Main Topics  . Smoking status: Never Smoker   . Smokeless tobacco: Never Used  . Alcohol Use: No  . Drug Use: No  . Sexual Activity: Not on file   Other Topics Concern  . Not on file   Social History Narrative   Regular exercise-yes 3 days a week   Diet: healthy    Family History  Problem Relation Age of Onset  . Depression Mother   . Lupus Mother   . Sjogren's syndrome Mother   . Aneurysm Father     Allergies  Allergen Reactions  . Milk-Related Compounds   . Peanut-Containing Drug Products     Medication list reviewed and updated in full in [facility].  ROS: GEN: Acute illness details above GI: Tolerating PO intake GU: maintaining adequate hydration and urination Pulm: No SOB Interactive and getting along well at home.  Otherwise, ROS is as per the HPI.   Objective:   BP 110/72 mmHg  Pulse 69  Temp(Src) 98.3 F (36.8 C) (Oral)  Ht 5' 6.5" (1.689 m)  Wt 190 lb 8 oz (86.41 kg)  BMI 30.29 kg/m2   Gen: WDWN, NAD; A & O x3, cooperative. Pleasant.Globally Non-toxic HEENT: Normocephalic and atraumatic. Throat clear, w/o exudate, R TM clear, L TM - good landmarks, + fluid, pinkish present. rhinnorhea.  MMM Frontal sinuses: NT Max sinuses: NT NECK: Anterior cervical  LAD is absent CV: RRR, No  M/G/R, cap refill <2 sec PULM: Breathing comfortably in no respiratory distress. no wheezing, crackles, rhonchi EXT: No c/c/e PSYCH: Friendly, good eye contact MSK: Nml gait     Laboratory and Imaging Data:  Assessment and Plan:   Acute mucoid otitis media of left ear  Eustachian tube dysfunction, unspecified laterality  Infection may be gone, or the patient may have a resolving otitis media with pure eustachian tube dysfunction.  Hold paper antibiotic prescription for now.  If she continues to worsens and her pain level  gets higher, and fillet over the weekend.  New Prescriptions   AMOXICILLIN-CLAVULANATE (AUGMENTIN) 875-125 MG PER TABLET    Take 1 tablet by mouth 2 (two) times daily.   Patient Instructions  Eustachian Tube Dysfunction: There is a tube that connects between the sinuses and behind the ear called the "eustachian tube." Sometimes when you have allergies, a cold, or nasal congestion for any reason this tube can get blocked and pressure cannot equalize in your ears. (Like if you swim in deep water) This can also trap fluid behind the ear and give you a full, pressure-like sensation that is uncomfortable, but it is not an ear infection.  Recommendations: Afrin Nasal Spray: 2 sprays twice a day for a maximum of 3-4 days (longer than this and your nose gets addicted, and you have rebound swelling that makes it worse.) Sudafed: Either pseudoephedrine or phenylephrine. As directed on box. (Not is heart problems or high blood pressure) Anti-Histamine: Allegra, Zyrtec, or Claritin. All over the counter now and once a day.  Nasal steroid. Nasacort is over the counter now. About 10 prescription ones exist.  If you develop fever > 100.4, then things can change fluid behind the ear does increase your risk of developing an ear infection.      Signed,  Elpidio Galea. Khamiyah Grefe, MD   Patient's Medications  New Prescriptions   AMOXICILLIN-CLAVULANATE (AUGMENTIN) 875-125 MG PER TABLET    Take 1 tablet by mouth 2 (two) times daily.  Previous Medications   AMOXICILLIN-CLAVULANATE (AUGMENTIN) 875-125 MG PER TABLET    Take 1 tablet by mouth 2 (two) times daily.   MINIVELLE 0.05 MG/24HR PATCH    PLACE 1 PATCH (0.05 MG TOTAL) ONTO THE SKIN 2 (TWO) TIMES A WEEK.   NASONEX 50 MCG/ACT NASAL SPRAY    USE 2 SPRAYS IN EACH NOSTRIL EVERY DAY   OMEPRAZOLE (PRILOSEC) 20 MG CAPSULE    Take 1 capsule (20 mg total) by mouth daily.   SUMATRIPTAN (IMITREX) 100 MG TABLET    TAKE 1 TABLET BY MOUTH EVERY 2 HOURS AS NEEDED FOR  MIGRAINE AS DIRECTED   SUMATRIPTAN (IMITREX) 50 MG TABLET    Take 1 tablet (50 mg total) by mouth every 2 (two) hours as needed for migraine.   VALACYCLOVIR (VALTREX) 1000 MG TABLET    TAKE 2 TABLETS BY MOUTH TWICE A DAY  Modified Medications   No medications on file  Discontinued Medications   ALBUTEROL (PROVENTIL HFA;VENTOLIN HFA) 108 (90 BASE) MCG/ACT INHALER    Inhale 2 puffs into the lungs every 6 (six) hours as needed.

## 2015-06-08 NOTE — Patient Instructions (Signed)
Eustachian Tube Dysfunction: There is a tube that connects between the sinuses and behind the ear called the "eustachian tube." Sometimes when you have allergies, a cold, or nasal congestion for any reason this tube can get blocked and pressure cannot equalize in your ears. (Like if you swim in deep water) This can also trap fluid behind the ear and give you a full, pressure-like sensation that is uncomfortable, but it is not an ear infection.  Recommendations: Afrin Nasal Spray: 2 sprays twice a day for a maximum of 3-4 days (longer than this and your nose gets addicted, and you have rebound swelling that makes it worse.) Sudafed: Either pseudoephedrine or phenylephrine. As directed on box. (Not is heart problems or high blood pressure) Anti-Histamine: Allegra, Zyrtec, or Claritin. All over the counter now and once a day.  Nasal steroid. Nasacort is over the counter now. About 10 prescription ones exist.  If you develop fever > 100.4, then things can change fluid behind the ear does increase your risk of developing an ear infection.  

## 2015-06-08 NOTE — Progress Notes (Signed)
Pre visit review using our clinic review tool, if applicable. No additional management support is needed unless otherwise documented below in the visit note. 

## 2015-07-17 ENCOUNTER — Other Ambulatory Visit: Payer: Self-pay | Admitting: Family Medicine

## 2015-11-09 ENCOUNTER — Ambulatory Visit (INDEPENDENT_AMBULATORY_CARE_PROVIDER_SITE_OTHER): Payer: 59 | Admitting: Family Medicine

## 2015-11-09 ENCOUNTER — Encounter: Payer: Self-pay | Admitting: Family Medicine

## 2015-11-09 VITALS — BP 126/78 | HR 72 | Temp 98.4°F | Ht 66.5 in | Wt 211.5 lb

## 2015-11-09 DIAGNOSIS — J019 Acute sinusitis, unspecified: Secondary | ICD-10-CM | POA: Diagnosis not present

## 2015-11-09 MED ORDER — AMOXICILLIN-POT CLAVULANATE 875-125 MG PO TABS: 1.0000 | ORAL_TABLET | Freq: Two times a day (BID) | ORAL | Status: DC

## 2015-11-09 MED ORDER — FLUCONAZOLE 150 MG PO TABS
150.0000 mg | ORAL_TABLET | Freq: Once | ORAL | Status: DC
Start: 2015-11-09 — End: 2015-12-26

## 2015-11-09 NOTE — Progress Notes (Signed)
Subjective:  Patient ID: Mary Keith, female    DOB: 1976-10-19  Age: 40 y.o. MRN: [id number]  CC: Sinus pressure/congestion  HPI:  40 year old female presents to the clinic with the above complaints.  Sinus pressure/congestion  Ongoing for the past 1.5 weeks.  Associated nasal discharge (green/bloody).  She also reports associated dental pain.  No associated fever.  She's been using Sudafed, Mucinex, Allegra, Afrin with no resolution in her symptoms.  No known exacerbating factors.  Additionally, she reports associated post nasal drip and cough.  Social Hx   Social History   Social History  . Marital Status: Married    Spouse Name: N/A  . Number of Children: N/A  . Years of Education: N/A   Occupational History  . 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 Smoking status: Never Smoker   . Smokeless tobacco: Never Used  . Alcohol Use: No  . Drug Use: No  . Sexual Activity: Not Asked   Other Topics Concern  . None   Social History Narrative   Regular exercise-yes 3 days a week   Diet: healthy    Review of Systems  Constitutional: Negative for fever.  HENT: Positive for congestion and sinus pressure.        Dental pain.  Respiratory: Positive for cough.    Objective:  BP 126/78 mmHg  Pulse 72  Temp(Src) 98.4 F (36.9 C) (Oral)  Ht 5' 6.5" (1.689 m)  Wt 211 lb 8 oz (95.936 kg)  BMI 33.63 kg/m2  SpO2 98%  BP/Weight 11/09/2015 06/08/2015 03/16/2015  Systolic BP 126 110 111  Diastolic BP 78 72 78  Wt. (Lbs) 211.5 190.5 187  BMI 33.63 30.29 29.73   Physical Exam  Constitutional: She appears well-developed. No distress.  HENT:  Head: Normocephalic and atraumatic.  Right Ear: Tympanic membrane normal.  Left Ear: Tympanic membrane normal.  Nose: Mucosal edema present.  Mouth/Throat: Oropharynx is clear and moist.  Maxillary sinus tenderness to palpation.  Eyes: Conjunctivae are normal.  Neck: Neck supple.  Cardiovascular:  Normal rate and regular rhythm.   No murmur heard. Pulmonary/Chest: Effort normal and breath sounds normal. No respiratory distress. She has no wheezes. She has no rales.  Neurological: She is alert.  Psychiatric: She has a normal mood and affect.  Vitals reviewed.  Lab Results  Component Value Date   WBC 7.1 11/04/2013   HGB 13.9 11/04/2013   HCT 41.0 11/04/2013   PLT 279.0 11/04/2013   GLUCOSE 91 03/29/2015   CHOL 174 03/14/2015   TRIG 58.0 03/14/2015   HDL 63.30 03/14/2015   LDLCALC 99 03/14/2015   ALT 17 03/14/2015   AST 22 03/14/2015   NA 136 03/29/2015   K 4.6 03/29/2015   CL 103 03/29/2015   CREATININE 0.97 03/29/2015   BUN 14 03/29/2015   CO2 26 03/29/2015   TSH 4.34 03/29/2015    Assessment & Plan:   Problem List Items Addressed This Visit    Acute sinusitis - Primary    New problem. History in physical exam consistent with acute sinusitis. Treating with Augmentin. Diflucan given as patient is prone to use infections with antibiotic therapy.      Relevant Medications   amoxicillin-clavulanate (AUGMENTIN) 875-125 MG tablet   fluconazole (DIFLUCAN) 150 MG tablet      Meds ordered this encounter  Medications  . amoxicillin-clavulanate (AUGMENTIN) 875-125 MG tablet    Sig: Take 1 tablet by mouth 2 (  two) times daily.    Dispense:  20 tablet    Refill:  0  . fluconazole (DIFLUCAN) 150 MG tablet    Sig: Take 1 tablet (150 mg total) by mouth once. Repeat x 1 in 72 hours.    Dispense:  2 tablet    Refill:  2   Follow-up: PRN  Everlene Other DO [facility]

## 2015-11-09 NOTE — Assessment & Plan Note (Signed)
New problem. History in physical exam consistent with acute sinusitis. Treating with Augmentin. Diflucan given as patient is prone to use infections with antibiotic therapy.

## 2015-11-09 NOTE — Patient Instructions (Signed)
Take the antibiotic as prescribed.  Continue the nasonex.  Follow up if you worsen or fail to improve.  Take care  Dr. Adriana Simasook  Sinusitis, Adult Sinusitis is redness, soreness, and inflammation of the paranasal sinuses. Paranasal sinuses are air pockets within the bones of your face. They are located beneath your eyes, in the middle of your forehead, and above your eyes. In healthy paranasal sinuses, mucus is able to drain out, and air is able to circulate through them by way of your nose. However, when your paranasal sinuses are inflamed, mucus and air can become trapped. This can allow bacteria and other germs to grow and cause infection. Sinusitis can develop quickly and last only a short time (acute) or continue over a long period (chronic). Sinusitis that lasts for more than 12 weeks is considered chronic. CAUSES Causes of sinusitis include:  Allergies.  Structural abnormalities, such as displacement of the cartilage that separates your nostrils (deviated septum), which can decrease the air flow through your nose and sinuses and affect sinus drainage.  Functional abnormalities, such as when the small hairs (cilia) that line your sinuses and help remove mucus do not work properly or are not present. SIGNS AND SYMPTOMS Symptoms of acute and chronic sinusitis are the same. The primary symptoms are pain and pressure around the affected sinuses. Other symptoms include:  Upper toothache.  Earache.  Headache.  Bad breath.  Decreased sense of smell and taste.  A cough, which worsens when you are lying flat.  Fatigue.  Fever.  Thick drainage from your nose, which often is green and may contain pus (purulent).  Swelling and warmth over the affected sinuses. DIAGNOSIS Your health care provider will perform a physical exam. During your exam, your health care provider may perform any of the following to help determine if you have acute sinusitis or chronic sinusitis:  Look in your  nose for signs of abnormal growths in your nostrils (nasal polyps).  Tap over the affected sinus to check for signs of infection.  View the inside of your sinuses using an imaging device that has a light attached (endoscope). If your health care provider suspects that you have chronic sinusitis, one or more of the following tests may be recommended:  Allergy tests.  Nasal culture. A sample of mucus is taken from your nose, sent to a lab, and screened for bacteria.  Nasal cytology. A sample of mucus is taken from your nose and examined by your health care provider to determine if your sinusitis is related to an allergy. TREATMENT Most cases of acute sinusitis are related to a viral infection and will resolve on their own within 10 days. Sometimes, medicines are prescribed to help relieve symptoms of both acute and chronic sinusitis. These may include pain medicines, decongestants, nasal steroid sprays, or saline sprays. However, for sinusitis related to a bacterial infection, your health care provider will prescribe antibiotic medicines. These are medicines that will help kill the bacteria causing the infection. Rarely, sinusitis is caused by a fungal infection. In these cases, your health care provider will prescribe antifungal medicine. For some cases of chronic sinusitis, surgery is needed. Generally, these are cases in which sinusitis recurs more than 3 times per year, despite other treatments. HOME CARE INSTRUCTIONS  Drink plenty of water. Water helps thin the mucus so your sinuses can drain more easily.  Use a humidifier.  Inhale steam 3-4 times a day (for example, sit in the bathroom with the shower running).  Apply  a warm, moist washcloth to your face 3-4 times a day, or as directed by your health care provider.  Use saline nasal sprays to help moisten and clean your sinuses.  Take medicines only as directed by your health care provider.  If you were prescribed either an  antibiotic or antifungal medicine, finish it all even if you start to feel better. SEEK IMMEDIATE MEDICAL CARE IF:  You have increasing pain or severe headaches.  You have nausea, vomiting, or drowsiness.  You have swelling around your face.  You have vision problems.  You have a stiff neck.  You have difficulty breathing.   This information is not intended to replace advice given to you by your health care provider. Make sure you discuss any questions you have with your health care provider.   Document Released: 10/20/2005 Document Revised: 11/10/2014 Document Reviewed: 11/04/2011 Elsevier Interactive Patient Education [employer].

## 2015-11-09 NOTE — Progress Notes (Signed)
Pre visit review using our clinic review tool, if applicable. No additional management support is needed unless otherwise documented below in the visit note. 

## 2015-12-25 ENCOUNTER — Telehealth: Payer: Self-pay | Admitting: Family Medicine

## 2015-12-25 NOTE — Telephone Encounter (Signed)
Pt has appt scheduled 12/26/15 at 9:30 with Mayra Reel NP.

## 2015-12-25 NOTE — Telephone Encounter (Signed)
[facility] Day - Client TELEPHONE ADVICE RECORD [facility] Patient Name: Obdulia Mersch DOB: 22-Jun-1976 Initial Comment Caller states she thinks she caught the flu from her son. Nurse Assessment Nurse: Lane Hacker, RN, Elvin So Date/Time (Eastern Time): 12/25/2015 1:59:29 PM Confirm and document reason for call. If symptomatic, describe symptoms. You must click the next button to save text entered. ---Caller states she thinks she caught the flu from her son. She had temp. between 99 - 100.5 by forehead, with headache, stuffy nose, chills, body aches, a little congested mild cough. Mild right ear pain. S/S started Sunday morning. All symptoms started that morning. -- Son diagnosed with flu Friday AM - + for type A. -- She had the flu shot. She has a migraine that started yesterday AM, worse this am around 3 am, and took Imitrex. Better now, but still some sinus pain just above the eye brows. Has the patient traveled out of the country within the last 30 days? ---Not Applicable Does the patient have any new or worsening symptoms? ---Yes Will a triage be completed? ---Yes Related visit to physician within the last 2 weeks? ---No Does the PT have any chronic conditions? (i.e. diabetes, asthma, etc.) ---Yes List chronic conditions. ---Migraines Is the patient pregnant or possibly pregnant? (Ask all females between the ages of 80-55) ---No Is this a behavioral health or substance abuse call? ---No Guidelines Guideline Title Affirmed Question Affirmed Notes Influenza - Seasonal [1] Using nasal washes and pain medicine > 24 hours AND [2] sinus pain (around cheekbone or eye) persists Final Disposition User See Physician within [address] Hours [place], [place], [place] Comments Appt with Vernona Rieger NP at 9:30 am made as Dr. Ermalene Searing is out of office. Referrals REFERRED TO PCP OFFICE Disagree/Comply: Comply

## 2015-12-26 ENCOUNTER — Ambulatory Visit (INDEPENDENT_AMBULATORY_CARE_PROVIDER_SITE_OTHER): Payer: 59 | Admitting: Primary Care

## 2015-12-26 ENCOUNTER — Encounter: Payer: Self-pay | Admitting: Primary Care

## 2015-12-26 VITALS — BP 118/84 | HR 87 | Temp 98.4°F | Ht 66.5 in | Wt 211.0 lb

## 2015-12-26 DIAGNOSIS — Z20828 Contact with and (suspected) exposure to other viral communicable diseases: Secondary | ICD-10-CM | POA: Diagnosis not present

## 2015-12-26 LAB — POCT INFLUENZA A/B
INFLUENZA A, POC: NEGATIVE
Influenza B, POC: NEGATIVE

## 2015-12-26 MED ORDER — OSELTAMIVIR PHOSPHATE 75 MG PO CAPS: 75.0000 mg | ORAL_CAPSULE | Freq: Every day | ORAL | Status: DC

## 2015-12-26 NOTE — Patient Instructions (Signed)
Your flu test was negative, however, because you were exposed to the flu you should be treated.  Start Tamiflu capsules. Take 1 capsule by mouth once daily for 7 days.  Continue Tylenol or ibuprofen as needed for fevers and body aches.  Nasal Congestion: Try using Flonase (fluticasone) nasal spray. Instill 2 sprays in each nostril once daily.   Cough: Robitussin DM. This may be purchased over the counter.  It was a pleasure meeting you!

## 2015-12-26 NOTE — Progress Notes (Signed)
Subjective:    Patient ID: Mary Keith, female    DOB: 02/26/1976, 40 y.o.   MRN: [id number]  HPI  Ms. Schnoebelen is a 40 year old female who presents today with a chief complaint of fever. She also reports sinus pressure, earache, cough, body aches, headache, chills. Her symptoms initially began Sunday after waking. Her son was diagnosed with the flu one week ago. She's taken tylenol and ibuprofen, and is using saline nasal spray with temporary reduction in fevers. Fever last night was 101. Her cough is non productive.   Review of Systems  Constitutional: Positive for fever, chills and fatigue.  HENT: Positive for congestion, ear pain and sinus pressure.   Respiratory: Positive for cough.   Musculoskeletal: Positive for myalgias.       Past Medical History  Diagnosis Date  . Unspecified urinary incontinence   . Pure hypercholesterolemia   . Elevated blood pressure reading without diagnosis of hypertension   . Migraine without aura, without mention of intractable migraine without mention of status migrainosus   . Depressive disorder, not elsewhere classified     Social History   Social History  . Marital Status: Married    Spouse Name: N/A  . Number of Children: N/A  . Years of Education: N/A   Occupational History  . Immunologist town of [employer]    Social History Main Topics  . Smoking status: Never Smoker   . Smokeless tobacco: Never Used  . Alcohol Use: No  . Drug Use: No  . Sexual Activity: Not on file   Other Topics Concern  . Not on file   Social History Narrative   Regular exercise-yes 3 days a week   Diet: healthy    Past Surgical History  Procedure Laterality Date  . Tonsillectomy and adenoidectomy    . Abdominal hysterectomy      Family History  Problem Relation Age of Onset  . Depression Mother   . Lupus Mother   . Sjogren's syndrome Mother   . Aneurysm Father     Allergies  Allergen Reactions  . Milk-Related Compounds   .  Peanut-Containing Drug Products     Current Outpatient Prescriptions on File Prior to Visit  Medication Sig Dispense Refill  . MINIVELLE 0.05 MG/24HR patch PLACE 1 PATCH (0.05 MG TOTAL) ONTO THE SKIN 2 (TWO) TIMES A WEEK. 8 patch 11  . NASONEX 50 MCG/ACT nasal spray USE 2 SPRAYS IN EACH NOSTRIL EVERY DAY 17 g 5  . omeprazole (PRILOSEC) 20 MG capsule TAKE 1 CAPSULE (20 MG TOTAL) BY MOUTH DAILY. 30 capsule 11  . SUMAtriptan (IMITREX) 100 MG tablet TAKE 1 TABLET BY MOUTH EVERY 2 HOURS AS NEEDED FOR MIGRAINE AS DIRECTED 9 tablet 2  . SUMAtriptan (IMITREX) 50 MG tablet Take 1 tablet (50 mg total) by mouth every 2 (two) hours as needed for migraine. 10 tablet 5  . valACYclovir (VALTREX) 1000 MG tablet TAKE 2 TABLETS BY MOUTH TWICE A DAY 4 tablet 2   No current facility-administered medications on file prior to visit.    BP 118/84 mmHg  Pulse 87  Temp(Src) 98.4 F (36.9 C) (Oral)  Ht 5' 6.5" (1.689 m)  Wt 211 lb (95.709 kg)  BMI 33.55 kg/m2  SpO2 98%    Objective:   Physical Exam  Constitutional: She appears well-nourished. She appears ill.  HENT:  Right Ear: Tympanic membrane and ear canal normal.  Left Ear: Tympanic membrane and ear canal normal.  Nose: Right sinus  exhibits no maxillary sinus tenderness and no frontal sinus tenderness. Left sinus exhibits frontal sinus tenderness. Left sinus exhibits no maxillary sinus tenderness.  Mouth/Throat: Oropharynx is clear and moist.  Eyes: Conjunctivae are normal.  Neck: Neck supple.  Cardiovascular: Normal rate and regular rhythm.   Pulmonary/Chest: Effort normal and breath sounds normal.  Lymphadenopathy:    She has no cervical adenopathy.  Skin: Skin is warm and dry.          Assessment & Plan:  Viral URI:  Exposure to flu from son.  Sudden onset of flu like symptoms. Overall no improvement with OTC's. Exam mostly unremarkable, however, she does appear ill. Rapid Flu: Negative No suspicion for acute bacterial  involvement. Due to exposure, will treat her with Tamiflu. RX sent to pharmacy. Fluids, rest, continue tylenol PRN.

## 2015-12-26 NOTE — Addendum Note (Signed)
Addended by: Tawnya Crook on: 12/26/2015 02:14 PM   Modules accepted: Orders

## 2015-12-26 NOTE — Progress Notes (Signed)
Pre visit review using our clinic review tool, if applicable. No additional management support is needed unless otherwise documented below in the visit note. 

## 2016-02-16 ENCOUNTER — Other Ambulatory Visit: Payer: Self-pay | Admitting: Family Medicine

## 2016-04-15 ENCOUNTER — Other Ambulatory Visit: Payer: Self-pay | Admitting: Family Medicine

## 2016-04-15 DIAGNOSIS — Z1231 Encounter for screening mammogram for malignant neoplasm of breast: Secondary | ICD-10-CM

## 2016-04-25 ENCOUNTER — Other Ambulatory Visit: Payer: Self-pay | Admitting: Family Medicine

## 2016-04-25 ENCOUNTER — Ambulatory Visit
Admission: RE | Admit: 2016-04-25 | Discharge: 2016-04-25 | Disposition: A | Discharge status: Home / Self Care | Payer: 59 | Source: Ambulatory Visit | Attending: Family Medicine | Admitting: Family Medicine

## 2016-04-25 DIAGNOSIS — Z1231 Encounter for screening mammogram for malignant neoplasm of breast: Secondary | ICD-10-CM

## 2016-05-08 ENCOUNTER — Other Ambulatory Visit (INDEPENDENT_AMBULATORY_CARE_PROVIDER_SITE_OTHER): Payer: 59

## 2016-05-08 ENCOUNTER — Telehealth: Payer: Self-pay | Admitting: Family Medicine

## 2016-05-08 DIAGNOSIS — E7800 Pure hypercholesterolemia, unspecified: Secondary | ICD-10-CM

## 2016-05-08 DIAGNOSIS — E038 Other specified hypothyroidism: Secondary | ICD-10-CM

## 2016-05-08 LAB — COMPREHENSIVE METABOLIC PANEL
ALBUMIN: 4 g/dL (ref 3.5–5.2)
ALT: 17 U/L (ref 0–35)
AST: 20 U/L (ref 0–37)
Alkaline Phosphatase: 50 U/L (ref 39–117)
BUN: 14 mg/dL (ref 6–23)
CHLORIDE: 102 meq/L (ref 96–112)
CO2: 29 meq/L (ref 19–32)
Calcium: 9.2 mg/dL (ref 8.4–10.5)
Creatinine, Ser: 1 mg/dL (ref 0.40–1.20)
GFR: 65.22 mL/min (ref >60.00)
Glucose, Bld: 88 mg/dL (ref 70–99)
POTASSIUM: 4.8 meq/L (ref 3.5–5.1)
SODIUM: 135 meq/L (ref 135–145)
Total Bilirubin: 0.5 mg/dL (ref 0.2–1.2)
Total Protein: 6.3 g/dL (ref 6.0–8.3)

## 2016-05-08 LAB — LIPID PANEL
CHOL/HDL RATIO: 3
CHOLESTEROL: 188 mg/dL (ref 0–200)
HDL: 55.4 mg/dL (ref >39.00)
LDL CALC: 106 mg/dL — AB (ref 0–99)
NonHDL: 132.34
TRIGLYCERIDES: 133 mg/dL (ref 0.0–149.0)
VLDL: 26.6 mg/dL (ref 0.0–40.0)

## 2016-05-08 LAB — T3, FREE: T3, Free: 2.9 pg/mL (ref 2.3–4.2)

## 2016-05-08 LAB — TSH: TSH: 5.16 u[IU]/mL — AB (ref 0.35–4.50)

## 2016-05-08 LAB — T4, FREE: Free T4: 0.76 ng/dL (ref 0.60–1.60)

## 2016-05-08 NOTE — Telephone Encounter (Signed)
-----   Message from Terri J Walsh sent at 04/30/2016  6:29 PM EDT ----- Regarding: Lab orders for Thursday, 7.6.17 Patient is scheduled for CPX labs, please order future labs, Thanks , Terri  

## 2016-05-13 ENCOUNTER — Encounter: Payer: Self-pay | Admitting: Family Medicine

## 2016-05-13 ENCOUNTER — Ambulatory Visit (INDEPENDENT_AMBULATORY_CARE_PROVIDER_SITE_OTHER): Payer: 59 | Admitting: Family Medicine

## 2016-05-13 VITALS — BP 100/60 | HR 81 | Temp 98.8°F | Ht 66.5 in | Wt 216.5 lb

## 2016-05-13 DIAGNOSIS — E7800 Pure hypercholesterolemia, unspecified: Secondary | ICD-10-CM

## 2016-05-13 DIAGNOSIS — E038 Other specified hypothyroidism: Secondary | ICD-10-CM

## 2016-05-13 DIAGNOSIS — G43009 Migraine without aura, not intractable, without status migrainosus: Secondary | ICD-10-CM | POA: Diagnosis not present

## 2016-05-13 DIAGNOSIS — Z0000 Encounter for general adult medical examination without abnormal findings: Secondary | ICD-10-CM

## 2016-05-13 DIAGNOSIS — M545 Low back pain: Secondary | ICD-10-CM | POA: Diagnosis not present

## 2016-05-13 DIAGNOSIS — M79604 Pain in right leg: Secondary | ICD-10-CM | POA: Insufficient documentation

## 2016-05-13 MED ORDER — SUMATRIPTAN SUCCINATE 50 MG PO TABS: 50.0000 mg | ORAL_TABLET | ORAL | Status: DC | PRN

## 2016-05-13 MED ORDER — ESTRADIOL 0.05 MG/24HR TD PTTW: MEDICATED_PATCH | TRANSDERMAL | Status: DC

## 2016-05-13 MED ORDER — VALACYCLOVIR HCL 1 G PO TABS: 2000.0000 mg | ORAL_TABLET | Freq: Two times a day (BID) | ORAL | Status: DC

## 2016-05-13 MED ORDER — MOMETASONE FUROATE 50 MCG/ACT NA SUSP: 2.0000 | Freq: Every day | NASAL | Status: DC

## 2016-05-13 MED ORDER — SUMATRIPTAN SUCCINATE 100 MG PO TABS: ORAL_TABLET | ORAL | Status: DC

## 2016-05-13 MED ORDER — PREDNISONE 20 MG PO TABS: ORAL_TABLET | ORAL | Status: DC

## 2016-05-13 MED ORDER — OMEPRAZOLE 20 MG PO CPDR: DELAYED_RELEASE_CAPSULE | ORAL | Status: DC

## 2016-05-13 NOTE — Assessment & Plan Note (Signed)
Treat with heat, home PT, core strengthening and pred taper course followed by NSAIDS prn.. If not improving in 2 weeks follow up.

## 2016-05-13 NOTE — Assessment & Plan Note (Signed)
Work on low cholesterol diet.  LDL at goal  On no med.

## 2016-05-13 NOTE — Assessment & Plan Note (Signed)
TSH high but free t3 and free t4 are at goal.

## 2016-05-13 NOTE — Progress Notes (Signed)
40 year old female presents for wellness visit.  Overall doing well.. She has been having pain in low back and right buttock and into right thigh off and on x 3 years, more severe in last 6 months. No weakness or numbness in right leg.  Pain increases with bending, occ pops.  No fall, no injury.  She has seen chiropractor and massage therapist.  Hx of scoliosis.  Stable X-ray at chiropractor 3 years ago. Using ibuprofen 600 mg off and on .Marland Kitchen Limits given stomach upset.  Elevated Cholesterol: LDL at goal < 130 on no medication. Lab Results  Component Value Date   CHOL 188 05/08/2016   HDL 55.40 05/08/2016   LDLCALC 106* 05/08/2016   TRIG 133.0 05/08/2016   CHOLHDL 3 05/08/2016  Using medications without problems:None Muscle aches:  Diet compliance: Good. Exercise: 1-2 days a week. Walking, Has had to decrease given back pain. Other complaints: Wt Readings from Last 3 Encounters:  05/13/16 216 lb 8 oz (98.204 kg)  12/26/15 211 lb (95.709 kg)  11/09/15 211 lb 8 oz (95.936 kg)   Body mass index is 34.42 kg/(m^2).  Hypothyroid, borderline in past. Lab Results  Component Value Date   TSH 5.16* 05/08/2016  free t3 and free t4 nml.   GERD: Well controlled on omeprazole 20 mg daily.   Asthma, mild intermittant: She has not used albuterol in last year.   Allergies well controlled Nasonex not covered. She feels OTC does not work as well. No nighttime cough.  Migraine: Occuring every other month. Can usually determine the trigger. Work on [employer]. Using imitrex prn   Social History /Family History/Past Medical History reviewed and updated if needed.   Review of Systems  Constitutional: Negative for fever and fatigue.  HENT: Negative for ear pain.  Eyes: Negative for pain.  Respiratory: Negative for chest tightness and shortness of breath.  Cardiovascular: Negative for chest pain, palpitations and leg swelling.  Gastrointestinal: Negative for  abdominal pain.  Genitourinary: Negative for dysuria.  Objective:   Physical Exam  Constitutional: Vital signs are normal. She appears well-developed and well-nourished. She is cooperative. Non-toxic appearance. She does not appear ill. No distress.  HENT:  Head: Normocephalic.  Right Ear: Hearing, tympanic membrane, external ear and ear canal normal. Tympanic membrane is not erythematous, not retracted and not bulging.  Left Ear: Hearing, tympanic membrane, external ear and ear canal normal. Tympanic membrane is not erythematous, not retracted and not bulging.  Nose: No mucosal edema or rhinorrhea. Right sinus exhibits no maxillary sinus tenderness and no frontal sinus tenderness. Left sinus exhibits no maxillary sinus tenderness and no frontal sinus tenderness.  Mouth/Throat: Uvula is midline, oropharynx is clear and moist and mucous membranes are normal.  Eyes: Conjunctivae normal, EOM and lids are normal. Pupils are equal, round, and reactive to light. No foreign bodies found.  Neck: Trachea normal and normal range of motion. Neck supple. Carotid bruit is not present. No mass and no thyromegaly present.  Cardiovascular: Normal rate, regular rhythm, S1 normal, S2 normal, normal heart sounds, intact distal pulses and normal pulses. Exam reveals no gallop and no friction rub.  No murmur heard.  Pulmonary/Chest: Effort normal and breath sounds normal. Not tachypneic. No respiratory distress. She has no decreased breath sounds. She has no wheezes. She has no rhonchi. She has no rales.  Abdominal: Soft. Normal appearance and bowel sounds are normal. There is no tenderness.  Neurological: She is alert.  Skin: Skin is warm, dry and  intact. No rash noted.  Psychiatric: Her speech is normal and behavior is normal. Judgment and thought content normal. Her mood appears not anxious. Cognition and memory are normal. She does not exhibit a depressed mood.   MSK: ttp in lumbar vertebra and  over right paraspinous muscles, ttp in right sciatic notch, positive SLR on right, neg faber's  Nml stretght in bilateral legs, nml sensation throughout  Antalgic gait. Breast exam: No mass, nodules, thickening, tenderness, bulging, retraction, inflamation, nipple discharge or skin changes noted.  No axillary or clavicular LA.  Chaperoned exam.    A/P:  The patient's preventative maintenance and recommended screening tests for an annual wellness exam were reviewed in full today. Brought up to date unless services declined.  Counselled on the importance of diet, exercise, and its role in overall health and mortality. The patient's FH and SH was reviewed, including their home life, tobacco status, and drug and alcohol status.    Mammogram: She has dense breasts... nml mammo 04/2016 PAP/ DVE: TAH, no vaginal symptoms. No early family history colon cancer except PGF late 1850s. Vaccines: Tdap Uptodate Nonsmoker HIV: refused

## 2016-05-13 NOTE — Assessment & Plan Note (Signed)
Stable control on no preventative. 

## 2016-05-13 NOTE — Patient Instructions (Addendum)
Heat on low back, gentle stretching, start home physical therapy, massage. Complete course of prednisone then use ibuprofen 800 mg every 8 hours as needed for pain and inflammation.  If low back and buttock pain continued after 2 weeks follow up for further evaluation.

## 2016-05-13 NOTE — Progress Notes (Signed)
Pre visit review using our clinic review tool, if applicable. No additional management support is needed unless otherwise documented below in the visit note. 

## 2016-05-14 ENCOUNTER — Telehealth: Payer: Self-pay | Admitting: *Deleted

## 2016-05-14 NOTE — Telephone Encounter (Addendum)
[facility] for Mometasone.  PA completed on CoverMyMeds.  PA approved until 05/14/2017.  [facility] notified.

## 2016-05-30 ENCOUNTER — Ambulatory Visit (INDEPENDENT_AMBULATORY_CARE_PROVIDER_SITE_OTHER): Payer: 59 | Admitting: Primary Care

## 2016-05-30 ENCOUNTER — Telehealth: Payer: Self-pay

## 2016-05-30 ENCOUNTER — Encounter: Payer: Self-pay | Admitting: Primary Care

## 2016-05-30 VITALS — BP 124/74 | HR 88 | Temp 98.2°F | Ht 66.5 in | Wt 219.1 lb

## 2016-05-30 DIAGNOSIS — R21 Rash and other nonspecific skin eruption: Secondary | ICD-10-CM

## 2016-05-30 MED ORDER — DESONIDE 0.05 % EX GEL: Freq: Two times a day (BID) | CUTANEOUS | 0 refills | Status: DC

## 2016-05-30 MED ORDER — CLINDAMYCIN PHOS-BENZOYL PEROX 1-5 % EX GEL: Freq: Every day | CUTANEOUS | 0 refills | Status: DC

## 2016-05-30 NOTE — Progress Notes (Signed)
Subjective:    Patient ID: Mary Keith, female    DOB: 1976-02-11, 40 y.o.   MRN: [id number]  HPI  [name] is a 40 year old female who presents today with a chief complaint of rash. Her rash is located to her bilateral cheeks, chin, and nose. The rash has been intermittent for years. She was once managed per dermatology and was once taking Desonate for presumed rosacea. The Desonate cream was helpful in reducing symptoms.   She was recently placed on a prednisone taper on 07/12 for low back pain and completed the prednisone on 07/19. She noticed complete resolve of her rash during the prednisone taper which quickly returned Monday this week, several days after completion of the taper. She's applied cortisone cream for the last several days without improvement. She's experiencing burning and warmth from the rash. Denies itching, fevers.  Review of Systems  Constitutional: Negative for fever.  Skin: Positive for rash.       Past Medical History:  Diagnosis Date  . Depressive disorder, not elsewhere classified   . Elevated blood pressure reading without diagnosis of hypertension   . Migraine without aura, without mention of intractable migraine without mention of status migrainosus   . Pure hypercholesterolemia   . Unspecified urinary incontinence      Social History   Social History  . Marital status: Married    Spouse name: N/A  . Number of children: N/A  . Years of education: N/A   Occupational History  . 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 Smoking status: Never Smoker  . Smokeless tobacco: Never Used  . Alcohol use No  . Drug use: No  . Sexual activity: Not on file   Other Topics Concern  . Not on file   Social History Narrative   Regular exercise-yes 3 days a week   Diet: healthy    Past Surgical History:  Procedure Laterality Date  . ABDOMINAL HYSTERECTOMY    . TONSILLECTOMY AND ADENOIDECTOMY      Family History   Problem Relation Age of Onset  . Depression Mother   . Lupus Mother   . Sjogren's syndrome Mother   . Aneurysm Father     Allergies  Allergen Reactions  . Milk-Related Compounds   . Peanut-Containing Drug Products     Current Outpatient Prescriptions on File Prior to Visit  Medication Sig Dispense Refill  . estradiol (VIVELLE-DOT) 0.05 MG/24HR patch PLACE 1 PATCH ONTO THE SKIN TWICE A WEEK 24 patch 3  . mometasone (NASONEX) 50 MCG/ACT nasal spray Place 2 sprays into the nose daily. 17 g 11  . omeprazole (PRILOSEC) 20 MG capsule TAKE 1 CAPSULE (20 MG TOTAL) BY MOUTH DAILY. 90 capsule 3  . SUMAtriptan (IMITREX) 100 MG tablet TAKE 1 TABLET BY MOUTH EVERY 2 HOURS AS NEEDED FOR MIGRAINE AS DIRECTED 10 tablet 2  . SUMAtriptan (IMITREX) 50 MG tablet Take 1 tablet (50 mg total) by mouth every 2 (two) hours as needed for migraine. 10 tablet 2  . valACYclovir (VALTREX) 1000 MG tablet Take 2 tablets (2,000 mg total) by mouth 2 (two) times daily. 4 tablet 5   No current facility-administered medications on file prior to visit.     BP 124/74 (BP Location: Right Arm, Patient Position: Sitting, Cuff Size: Normal)   Pulse 88   Temp 98.2 F (36.8 C) (Oral)   Ht 5' 6.5" (1.689 m)   Wt 219 lb  1.9 oz (99.4 kg)   SpO2 98%   BMI 34.84 kg/m    Objective:   Physical Exam  Constitutional: She appears well-nourished.  Neck: Neck supple.  Cardiovascular: Normal rate and regular rhythm.   Pulmonary/Chest: Effort normal and breath sounds normal.  Skin: Skin is warm and dry.  Rash representative of rosacea to bilateral cheeks nose and chin. Current minor breakout of oral herpes.          Assessment & Plan:  Rash:  Intermittent to the cheeks, nose, chin for years. Rash very representative of rosacea, no papules or open lesions. Since desonide has been helpful in the past, will provide her with a prescription today to use twice daily for 1-2 weeks. Discussed triggers of rosacea and handout  provided today. She will notify me if no improvement. At that point may need to consider metronidazole.  Mary Sheldon, NP

## 2016-05-30 NOTE — Telephone Encounter (Signed)
Pt left v/m; pt was seen earlier today; Desonate gel was sent to [facility] but ins co is requiring a PA and pt wants to know if there is a substitute med that could be sent to [facility] so pt could pick up prior to going out of town on 05/31/16.Please advise.

## 2016-05-30 NOTE — Telephone Encounter (Signed)
Noted, spoke with patient and sent prescription for BenzaClin gel to pharmacy so she can have treatment prior to leaving town tomorrow.

## 2016-05-30 NOTE — Patient Instructions (Signed)
Apply Desonate gel to the face twice daily until symptoms resolve (typically 1-2 weeks).  Please notify myself or Dr. Ermalene Searing if no improvement.  Have a great trip! It was a pleasure to see you today!   Rosacea Rosacea is a long-term (chronic) condition that affects the skin of the face, including the cheeks, nose, brow, and chin. This condition can also affect the eyes. Rosacea causes blood vessels near the surface of the skin to enlarge, which results in redness. CAUSES The cause of this condition is not known. Certain triggers can make rosacea worse, including:  Hot baths.  Exercise.  Sunlight.  Very hot or cold temperatures.  Hot or spicy foods and drinks.  Drinking alcohol.  Stress.  Taking blood pressure medicine.  Long-term use of topical steroids on the face. RISK FACTORS This condition is more likely to develop in:  People who are older than 40 years of age.  Women.  People who have light-colored skin (light complexion).  People who have a family history of rosacea. SYMPTOMS  Symptoms of this condition include:  Redness of the face.  Red bumps or pimples on the face.  A red, enlarged nose.  Blushing easily.  Red lines on the skin.  Irritated or burning feeling in the eyes.  Swollen eyelids. DIAGNOSIS This condition is diagnosed with a medical history and physical exam. TREATMENT There is no cure for this condition, but treatment can help to control your symptoms. Your health care provider may recommend that you see a skin specialist (dermatologist). Treatment may include:  Antibiotic medicines that are applied to the skin or taken as a pill.  Laser treatment to improve the appearance of the skin.  Surgery. This is rare. Your health care provider will also recommend the best way to take care of your skin. Even after your skin improves, you will likely need to continue treatment to prevent your rosacea from coming back. HOME CARE  INSTRUCTIONS Skin Care Take care of your skin as told by your health care provider. You may be told to do these things:  Wash your skin gently two or more times each day.  Use mild soap.  Use a sunscreen or sunblock with SPF 30 or greater.  Use gentle cosmetics that are meant for sensitive skin.  Shave with an electric shaver instead of a blade. Lifestyle  Try to keep track of what foods trigger this condition. Avoid any triggers. These may include:  Spicy foods.  Seafood.  Cheese.  Hot liquids.  Nuts.  Chocolate.  Iodized salt.  Do not drink alcohol.  Avoid extremely cold or hot temperatures.  Try to reduce your stress. If you need help, talk with your health care provider.  When you exercise, do these things to stay cool:  Limit your sun exposure.  Use a fan.  Do shorter and more frequent intervals of exercise. General Instructions   Keep all follow-up visits as told by your health care provider. This is important.  Take over-the-counter and prescription medicines only as told by your health care provider.  If your eyelids are affected, apply warm compresses to them. Do this as told by your health care provider.  If you were prescribed an antibiotic medicine, apply or take it as told by your health care provider. Do not stop using the antibiotic even if your condition improves. SEEK MEDICAL CARE IF:  Your symptoms get worse.  Your symptoms do not improve after two months of treatment.  You have new symptoms.  You have any changes in vision or you have problems with your eyes, such as redness or itching.  You feel depressed.  You lose your appetite.  You have trouble concentrating.   This information is not intended to replace advice given to you by your health care provider. Make sure you discuss any questions you have with your health care provider.   Document Released: 11/27/2004 Document Revised: 07/11/2015 Document Reviewed:  12/27/2014 Elsevier Interactive Patient Education [employer].

## 2016-05-30 NOTE — Progress Notes (Signed)
Pre visit review using our clinic review tool, if applicable. No additional management support is needed unless otherwise documented below in the visit note. 

## 2016-10-03 ENCOUNTER — Ambulatory Visit (INDEPENDENT_AMBULATORY_CARE_PROVIDER_SITE_OTHER): Payer: Managed Care, Other (non HMO) | Admitting: Family Medicine

## 2016-10-03 ENCOUNTER — Encounter: Payer: Self-pay | Admitting: Family Medicine

## 2016-10-03 VITALS — BP 120/84 | HR 75 | Temp 98.9°F | Ht 66.5 in | Wt 228.5 lb

## 2016-10-03 DIAGNOSIS — F331 Major depressive disorder, recurrent, moderate: Secondary | ICD-10-CM | POA: Diagnosis not present

## 2016-10-03 DIAGNOSIS — F411 Generalized anxiety disorder: Secondary | ICD-10-CM | POA: Insufficient documentation

## 2016-10-03 DIAGNOSIS — L719 Rosacea, unspecified: Secondary | ICD-10-CM | POA: Diagnosis not present

## 2016-10-03 MED ORDER — VENLAFAXINE HCL ER 37.5 MG PO CP24: ORAL_CAPSULE | ORAL | 3 refills | Status: DC

## 2016-10-03 MED ORDER — METRONIDAZOLE 1 % EX GEL: Freq: Every day | CUTANEOUS | 0 refills | Status: DC

## 2016-10-03 NOTE — Assessment & Plan Note (Signed)
Poor control. Start venlafaxine and taper up.  Counseling recommended.

## 2016-10-03 NOTE — Patient Instructions (Signed)
Start venlafaxine at bedtime. Increase to 2 tab daily if tolerating it after 1 week. Set up counselor at work.. or let me know if you need a referral.   Work on stress reduction and relaxation.

## 2016-10-03 NOTE — Assessment & Plan Note (Signed)
Start topical metronidazole

## 2016-10-03 NOTE — Progress Notes (Signed)
Pre visit review using our clinic review tool, if applicable. No additional management support is needed unless otherwise documented below in the visit note. 

## 2016-10-03 NOTE — Assessment & Plan Note (Signed)
Recommended SNRI  Initiation and counseling. Close follow up in 4 weeks.

## 2016-10-03 NOTE — Progress Notes (Signed)
   Subjective:    Patient ID: Mary Keith, female    DOB: 04/07/1976, 40 y.o.   MRN: [id number]  HPI  40 year old female present with new onset anxiety and stress. She has been having inssues with boss at work.  issues with spouse at [place].Marland Kitchen. He is acting Designer, fashion/clothingout character.   She had similar issue years ago.. Took medication (paxil/wellbutrin) 1999 when father passed away. No SE except had issues coming off.   She has been feeling overwhelmed through out the day.  No panic attacks.  Trouble falling asleep and frequent waking. No SI, no HI.  Heart racing, stomach upset, dizzy spells.   Advil PM has not helped with sleep.   GAD 17  PHQ9 18    With stress she has been having issues with rosacea. Red bumps on face and nose, cheeks, flushing. Desonide is not helping.  Review of Systems  Constitutional: Negative for fatigue and fever.  HENT: Negative for ear pain.   Eyes: Negative for pain.  Respiratory: Negative for chest tightness and shortness of breath.   Cardiovascular: Negative for chest pain, palpitations and leg swelling.  Gastrointestinal: Negative for abdominal pain.  Genitourinary: Negative for dysuria.       Objective:   Physical Exam  Constitutional: Vital signs are normal. She appears well-developed and well-nourished. She is cooperative.  Non-toxic appearance. She does not appear ill. No distress.  HENT:  Head: Normocephalic.  Right Ear: Hearing, tympanic membrane, external ear and ear canal normal. Tympanic membrane is not erythematous, not retracted and not bulging.  Left Ear: Hearing, tympanic membrane, external ear and ear canal normal. Tympanic membrane is not erythematous, not retracted and not bulging.  Nose: No mucosal edema or rhinorrhea. Right sinus exhibits no maxillary sinus tenderness and no frontal sinus tenderness. Left sinus exhibits no maxillary sinus tenderness and no frontal sinus tenderness.  Mouth/Throat: Uvula is midline, oropharynx is clear and  moist and mucous membranes are normal.  Eyes: Conjunctivae, EOM and lids are normal. Pupils are equal, round, and reactive to light. Lids are everted and swept, no foreign bodies found.  Neck: Trachea normal and normal range of motion. Neck supple. Carotid bruit is not present. No thyroid mass and no thyromegaly present.  Cardiovascular: Normal rate, regular rhythm, S1 normal, S2 normal, normal heart sounds, intact distal pulses and normal pulses.  Exam reveals no gallop and no friction rub.   No murmur heard. Pulmonary/Chest: Effort normal and breath sounds normal. No tachypnea. No respiratory distress. She has no decreased breath sounds. She has no wheezes. She has no rhonchi. She has no rales.  Abdominal: Soft. Normal appearance and bowel sounds are normal. There is no tenderness.  Neurological: She is alert.  Skin: Skin is warm, dry and intact. Rash noted.  Erythematous nodules and small vessels across nose and cheeks  Psychiatric: Her speech is normal. Judgment and thought content normal. Her mood appears anxious. She is agitated. Cognition and memory are normal. She exhibits a depressed mood. She expresses no homicidal and no suicidal ideation. She expresses no suicidal plans and no homicidal plans.  Tearful during discussion          Assessment & Plan:

## 2016-10-11 ENCOUNTER — Encounter: Payer: Self-pay | Admitting: Family Medicine

## 2016-10-31 ENCOUNTER — Ambulatory Visit (INDEPENDENT_AMBULATORY_CARE_PROVIDER_SITE_OTHER): Payer: Managed Care, Other (non HMO) | Admitting: Family Medicine

## 2016-10-31 ENCOUNTER — Encounter: Payer: Self-pay | Admitting: Family Medicine

## 2016-10-31 DIAGNOSIS — F331 Major depressive disorder, recurrent, moderate: Secondary | ICD-10-CM

## 2016-10-31 DIAGNOSIS — F411 Generalized anxiety disorder: Secondary | ICD-10-CM

## 2016-10-31 MED ORDER — ESTRADIOL 0.05 MG/24HR TD PTTW: MEDICATED_PATCH | TRANSDERMAL | 3 refills | Status: DC

## 2016-10-31 MED ORDER — OMEPRAZOLE 20 MG PO CPDR: DELAYED_RELEASE_CAPSULE | ORAL | 3 refills | Status: DC

## 2016-10-31 MED ORDER — VENLAFAXINE HCL ER 37.5 MG PO CP24: ORAL_CAPSULE | ORAL | 1 refills | Status: DC

## 2016-10-31 NOTE — Progress Notes (Signed)
   Subjective:    Patient ID: Mary Keith, female    DOB: Sep 29, 1976, 40 y.o.   MRN: [id number]  HPI  40 year old female presents for follow up  Major depression and GAD.   At last OV 12/1 she was started on venlafaxine Started counselor at work. ON 12/9 she was already feeling better per Mychart note and opted to stay on lower dose of venlafaxine 37.5 mg daily.  Today she reports:  No SE to lower dose of the medication.  She feels much better overall. She has had 90% improvement. She still has a little issue falling asleep, but much better overall.  Mild fatigue. Still some issue over eating.. Especially over holiday.   She has been able to talk with her boss.. Plans on medicated conversation to work through.   [facility] 10/31/2016 10/03/2016 03/16/2015  Decreased Interest 0 2 0  Down, Depressed, Hopeless 0 2 0  PHQ - 2 Score 0 4 0  Altered sleeping 1 3 -  Tired, decreased energy 1 3 -  Change in appetite 1 3 -  Feeling bad or failure about yourself  0 1 -  Trouble concentrating 0 2 -  Moving slowly or fidgety/restless 0 2 -  Suicidal thoughts 0 0 -  PHQ-9 Score 3 18 -   Wt Readings from Last 3 Encounters:  10/31/16 229 lb 4 oz (104 kg)  10/03/16 228 lb 8 oz (103.6 kg)  05/30/16 219 lb 1.9 oz (99.4 kg)       Review of Systems  Constitutional: Negative for fatigue and fever.  HENT: Negative for ear pain.   Eyes: Negative for pain.  Respiratory: Negative for chest tightness and shortness of breath.   Cardiovascular: Negative for chest pain, palpitations and leg swelling.  Gastrointestinal: Negative for abdominal pain.  Genitourinary: Negative for dysuria.       Objective:   Physical Exam  Constitutional: Vital signs are normal. She appears well-developed and well-nourished. She is cooperative.  Non-toxic appearance. She does not appear ill. No distress.  HENT:  Head: Normocephalic.  Right Ear: Hearing, tympanic membrane and ear canal normal.  Tympanic membrane is not erythematous, not retracted and not bulging.  Left Ear: Hearing, tympanic membrane and ear canal normal. Tympanic membrane is not erythematous, not retracted and not bulging.  Nose: No mucosal edema or rhinorrhea. Right sinus exhibits no maxillary sinus tenderness and no frontal sinus tenderness. Left sinus exhibits no maxillary sinus tenderness and no frontal sinus tenderness.  Eyes: Conjunctivae, EOM and lids are normal.  Neck: Trachea normal and normal range of motion. Neck supple. Carotid bruit is not present. No thyroid mass and no thyromegaly present.  Cardiovascular: Normal rate, regular rhythm, S1 normal, S2 normal, normal heart sounds, intact distal pulses and normal pulses.  Exam reveals no gallop and no friction rub.   No murmur heard. Pulmonary/Chest: Effort normal and breath sounds normal. No tachypnea. No respiratory distress. She has no decreased breath sounds. She has no wheezes. She has no rhonchi. She has no rales.  Abdominal: Normal appearance and bowel sounds are normal.  Neurological: She is alert.  Skin: Skin is warm, dry and intact. No rash noted.  Psychiatric: Her speech is normal and behavior is normal. Judgment and thought content normal. Her mood appears not anxious. Cognition and memory are normal. She does not exhibit a depressed mood.          Assessment & Plan:

## 2016-10-31 NOTE — Patient Instructions (Signed)
I am so glad you are feeling better!  Follow up as scheduled.

## 2016-10-31 NOTE — Assessment & Plan Note (Signed)
Significant improvement in mood on venlafaxine low dose. Continue for 3-6 months. Encouraged exercise, weight loss, healthy eating habits.

## 2016-10-31 NOTE — Assessment & Plan Note (Signed)
Excellent improvement in anxiety symptoms.. Largely due to working through issues with boss at work.

## 2016-10-31 NOTE — Progress Notes (Signed)
Pre visit review using our clinic review tool, if applicable. No additional management support is needed unless otherwise documented below in the visit note. 

## 2017-01-12 ENCOUNTER — Encounter: Payer: Self-pay | Admitting: Family Medicine

## 2017-01-13 MED ORDER — ONDANSETRON 8 MG PO TBDP
8.0000 mg | ORAL_TABLET | Freq: Three times a day (TID) | ORAL | 0 refills | Status: DC | PRN
Start: 2017-01-13 — End: 2017-05-26

## 2017-02-27 ENCOUNTER — Encounter: Payer: Self-pay | Admitting: Family Medicine

## 2017-02-27 ENCOUNTER — Other Ambulatory Visit: Payer: Self-pay | Admitting: Family Medicine

## 2017-02-27 MED ORDER — ALBUTEROL SULFATE HFA 108 (90 BASE) MCG/ACT IN AERS: 2.0000 | INHALATION_SPRAY | Freq: Four times a day (QID) | RESPIRATORY_TRACT | 2 refills | Status: DC | PRN

## 2017-02-27 NOTE — Progress Notes (Signed)
albuterol

## 2017-03-23 ENCOUNTER — Other Ambulatory Visit: Payer: Self-pay | Admitting: Family Medicine

## 2017-03-23 DIAGNOSIS — Z1231 Encounter for screening mammogram for malignant neoplasm of breast: Secondary | ICD-10-CM

## 2017-03-30 ENCOUNTER — Other Ambulatory Visit: Payer: Self-pay | Admitting: Family Medicine

## 2017-04-27 ENCOUNTER — Ambulatory Visit
Admission: RE | Admit: 2017-04-27 | Discharge: 2017-04-27 | Disposition: A | Discharge status: Home / Self Care | Payer: Managed Care, Other (non HMO) | Source: Ambulatory Visit | Attending: Family Medicine | Admitting: Family Medicine

## 2017-04-27 DIAGNOSIS — Z1231 Encounter for screening mammogram for malignant neoplasm of breast: Secondary | ICD-10-CM | POA: Diagnosis present

## 2017-05-16 ENCOUNTER — Other Ambulatory Visit: Payer: Self-pay | Admitting: Family Medicine

## 2017-05-21 ENCOUNTER — Telehealth: Payer: Self-pay | Admitting: Family Medicine

## 2017-05-21 DIAGNOSIS — E7800 Pure hypercholesterolemia, unspecified: Secondary | ICD-10-CM

## 2017-05-21 DIAGNOSIS — E038 Other specified hypothyroidism: Secondary | ICD-10-CM

## 2017-05-21 NOTE — Addendum Note (Signed)
Addended by: Alvina ChouWALSH, TERRI J on: 05/21/2017 10:15 AM   Modules accepted: Orders

## 2017-05-21 NOTE — Telephone Encounter (Signed)
-----   Message from Natasha C Chavers sent at 05/15/2017  3:19 PM EDT ----- Regarding: Cpx labs Fri 7/19, need orders. Thanks! Please order  future cpx labs for pt's upcoming lab appt. Thanks Tasha  

## 2017-05-22 ENCOUNTER — Other Ambulatory Visit (INDEPENDENT_AMBULATORY_CARE_PROVIDER_SITE_OTHER): Payer: Managed Care, Other (non HMO)

## 2017-05-22 DIAGNOSIS — E038 Other specified hypothyroidism: Secondary | ICD-10-CM | POA: Diagnosis not present

## 2017-05-22 DIAGNOSIS — E7800 Pure hypercholesterolemia, unspecified: Secondary | ICD-10-CM | POA: Diagnosis not present

## 2017-05-22 LAB — COMPREHENSIVE METABOLIC PANEL
ALBUMIN: 4 g/dL (ref 3.5–5.2)
ALK PHOS: 66 U/L (ref 39–117)
ALT: 18 U/L (ref 0–35)
AST: 20 U/L (ref 0–37)
BUN: 10 mg/dL (ref 6–23)
CO2: 29 mEq/L (ref 19–32)
CREATININE: 0.96 mg/dL (ref 0.40–1.20)
Calcium: 9.6 mg/dL (ref 8.4–10.5)
Chloride: 102 mEq/L (ref 96–112)
GFR: 68.02 mL/min (ref >60.00)
GLUCOSE: 93 mg/dL (ref 70–99)
Potassium: 4.8 mEq/L (ref 3.5–5.1)
SODIUM: 136 meq/L (ref 135–145)
TOTAL PROTEIN: 6.9 g/dL (ref 6.0–8.3)
Total Bilirubin: 0.6 mg/dL (ref 0.2–1.2)

## 2017-05-22 LAB — T3, FREE: T3, Free: 3.2 pg/mL (ref 2.3–4.2)

## 2017-05-22 LAB — HEMOGLOBIN A1C: HEMOGLOBIN A1C: 5.7 % (ref 4.6–6.5)

## 2017-05-22 LAB — LIPID PANEL
CHOLESTEROL: 204 mg/dL — AB (ref 0–200)
HDL: 61.9 mg/dL (ref >39.00)
LDL Cholesterol: 123 mg/dL — ABNORMAL HIGH (ref 0–99)
NONHDL: 142.52
Total CHOL/HDL Ratio: 3
Triglycerides: 98 mg/dL (ref 0.0–149.0)
VLDL: 19.6 mg/dL (ref 0.0–40.0)

## 2017-05-22 LAB — T4, FREE: Free T4: 0.74 ng/dL (ref 0.60–1.60)

## 2017-05-22 LAB — TSH: TSH: 3.76 u[IU]/mL (ref 0.35–4.50)

## 2017-05-26 ENCOUNTER — Ambulatory Visit (INDEPENDENT_AMBULATORY_CARE_PROVIDER_SITE_OTHER): Payer: Managed Care, Other (non HMO) | Admitting: Family Medicine

## 2017-05-26 ENCOUNTER — Encounter: Payer: Self-pay | Admitting: Family Medicine

## 2017-05-26 VITALS — BP 120/70 | HR 72 | Temp 98.3°F | Ht 66.5 in | Wt 234.5 lb

## 2017-05-26 DIAGNOSIS — F331 Major depressive disorder, recurrent, moderate: Secondary | ICD-10-CM | POA: Diagnosis not present

## 2017-05-26 DIAGNOSIS — Z0000 Encounter for general adult medical examination without abnormal findings: Secondary | ICD-10-CM | POA: Diagnosis not present

## 2017-05-26 DIAGNOSIS — D049 Carcinoma in situ of skin, unspecified: Secondary | ICD-10-CM

## 2017-05-26 DIAGNOSIS — E038 Other specified hypothyroidism: Secondary | ICD-10-CM | POA: Diagnosis not present

## 2017-05-26 DIAGNOSIS — E7800 Pure hypercholesterolemia, unspecified: Secondary | ICD-10-CM | POA: Diagnosis not present

## 2017-05-26 DIAGNOSIS — G43009 Migraine without aura, not intractable, without status migrainosus: Secondary | ICD-10-CM

## 2017-05-26 NOTE — Assessment & Plan Note (Signed)
Work on low cholesterol diet, exercise and weight loss.  No current indication for statin medication.

## 2017-05-26 NOTE — Assessment & Plan Note (Signed)
Good control on current regimen. 

## 2017-05-26 NOTE — Patient Instructions (Addendum)
Work on low cholesterol, try to eat out less or make smarter choices.  Increase exercise as able.  Call if you are interested in rechecking cholesterol in 3 months.

## 2017-05-26 NOTE — Assessment & Plan Note (Signed)
Excellent control on current medication. No SE.

## 2017-05-26 NOTE — Progress Notes (Signed)
Subjective:    Patient ID: Mary Keith, female    DOB: [birth year], 41 y.o.   MRN: [id number]  HPI The patient is here for annual wellness exam and preventative care.    Elevated Cholesterol:  Lab Results  Component Value Date   CHOL 204 (H) 05/22/2017   HDL 61.90 05/22/2017   LDLCALC 123 (H) 05/22/2017   TRIG 98.0 05/22/2017   CHOLHDL 3 05/22/2017  Diet compliance: poor.. Eating out a lot Exercise: walks occ at mall Other complaints: Body mass index is 37.28 kg/m. Wt Readings from Last 3 Encounters:  05/26/17 234 lb 8 oz (106.4 kg)  10/31/16 229 lb 4 oz (104 kg)  10/03/16 228 lb 8 oz (103.6 kg)     Major depression, moderate: Good control on Effexor 37.5 mg daily.   Migraine: None in last 8 weeks.  Improved with ear piercing. Imitrex helps acutely.  Hypothyroidism: not on any medication.. Labs stable  Asthma, mild intermittant:  Rare problems with it. Using nasonex for allergies. She has not used albuterol in last year.    Blood pressure 120/70, pulse 72, temperature 98.3 F (36.8 C), temperature source Oral, height 5' 6.5" (1.689 m), weight 234 lb 8 oz (106.4 kg). Social History /Family History/Past Medical History reviewed in detail and updated in EMR if needed.  Review of Systems  Constitutional: Negative for fatigue and fever.  HENT: Negative for congestion.   Eyes: Negative for pain.  Respiratory: Negative for cough and shortness of breath.   Cardiovascular: Negative for chest pain, palpitations and leg swelling.  Gastrointestinal: Negative for abdominal pain.  Genitourinary: Negative for dysuria and vaginal bleeding.  Musculoskeletal: Negative for back pain.  Neurological: Negative for syncope, light-headedness and headaches.  Psychiatric/Behavioral: Negative for dysphoric mood.       Objective:   Physical Exam  Constitutional: Vital signs are normal. She appears well-developed and well-nourished. She is cooperative.  Non-toxic appearance. She does  not appear ill. No distress.  HENT:  Head: Normocephalic.  Right Ear: Hearing, tympanic membrane, external ear and ear canal normal.  Left Ear: Hearing, tympanic membrane, external ear and ear canal normal.  Nose: Nose normal.  Eyes: Pupils are equal, round, and reactive to light. Conjunctivae, EOM and lids are normal. Lids are everted and swept, no foreign bodies found.  Neck: Trachea normal and normal range of motion. Neck supple. Carotid bruit is not present. No thyroid mass and no thyromegaly present.  Cardiovascular: Normal rate, regular rhythm, S1 normal, S2 normal, normal heart sounds and intact distal pulses.  Exam reveals no gallop.   No murmur heard. Pulmonary/Chest: Effort normal and breath sounds normal. No respiratory distress. She has no wheezes. She has no rhonchi. She has no rales.  Abdominal: Soft. Normal appearance and bowel sounds are normal. She exhibits no distension, no fluid wave, no abdominal bruit and no mass. There is no hepatosplenomegaly. There is no tenderness. There is no rebound, no guarding and no CVA tenderness. No hernia.  Lymphadenopathy:    She has no cervical adenopathy.    She has no axillary adenopathy.  Neurological: She is alert. She has normal strength. No cranial nerve deficit or sensory deficit.  Skin: Skin is warm, dry and intact. No rash noted.  Psychiatric: Her speech is normal and behavior is normal. Judgment normal. Her mood appears not anxious. Cognition and memory are normal. She does not exhibit a depressed mood.          Assessment & Plan:  The  patient's preventative maintenance and recommended screening tests for an annual wellness exam were reviewed in full today. Brought up to date unless services declined.  Counselled on the importance of diet, exercise, and its role in overall health and mortality. The patient's FH and SH was reviewed, including their home life, tobacco status, and drug and alcohol status.   Mammogram: She has  dense breasts... nml mammo 04/2017 PAP/ DVE: TAH, no vaginal symptoms. No early family history colon cancer except PGF late 2150s. Vaccines: Tdap Uptodate Nonsmoker HIV: refused

## 2017-06-18 ENCOUNTER — Ambulatory Visit (INDEPENDENT_AMBULATORY_CARE_PROVIDER_SITE_OTHER): Payer: Managed Care, Other (non HMO) | Admitting: Family Medicine

## 2017-06-18 ENCOUNTER — Encounter: Payer: Self-pay | Admitting: Family Medicine

## 2017-06-18 ENCOUNTER — Ambulatory Visit (INDEPENDENT_AMBULATORY_CARE_PROVIDER_SITE_OTHER)
Admission: RE | Admit: 2017-06-18 | Discharge: 2017-06-18 | Disposition: A | Discharge status: Home / Self Care | Payer: Managed Care, Other (non HMO) | Source: Ambulatory Visit | Attending: Family Medicine | Admitting: Family Medicine

## 2017-06-18 VITALS — BP 108/68 | HR 85 | Temp 98.4°F | Ht 66.5 in | Wt 241.0 lb

## 2017-06-18 DIAGNOSIS — M79672 Pain in left foot: Secondary | ICD-10-CM

## 2017-06-18 NOTE — Progress Notes (Signed)
Dr. Karleen Hampshire T. Alicia Ackert, MD, CAQ Sports [facility] [address] [place], [address] Phone: [phone] Fax: [phone]  06/18/2017  Patient: Mary Keith, MRN: [id number], DOB: Apr 21, 1976, 41 y.o.  Primary Physician:  Excell Seltzer, MD   Chief Complaint  Patient presents with  . Acute Visit    Pt here today to discuss her left foot pain, slight swelling, alittle to walk on it, Pt does not recall any known injury to foot    Subjective:   Shevelle D Damian is a 41 y.o. very pleasant female patient who presents with the following:  Lateral foot, on ball of foot.   L 4th.  Last 6 weeks, increase in walking. She likes to walk for exercise, and recently over the summer she has been titrating up her exercise volume and increasing her walks.  Recently she has been having pain in the forefoot, and pain specifically when she goes up on the balls of her foot.  She is having pain now and having to limit her exercise currently.  Past Medical History, Surgical History, Social History, Family History, Problem List, Medications, and Allergies have been reviewed and updated if relevant.  Patient Active Problem List   Diagnosis Date Noted  . 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 Rosacea 10/03/2016  . Major depressive disorder, recurrent episode, moderate (HCC) 10/03/2016  . Generalized anxiety disorder 10/03/2016  . Lumbar pain with radiation down right leg 05/13/2016  . 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 HYPERCHOLESTEROLEMIA 08/09/2010  . Migraine without aura 08/09/2010  . ASTHMA, INTERMITTENT, MILD 08/09/2010  . GASTROESOPHAGEAL REFLUX DISEASE 08/09/2010  . URINARY INCONTINENCE 08/09/2010  . ELEVATED BP READING WITHOUT DX HYPERTENSION 08/09/2010    Past Medical History:  Diagnosis Date  . Depressive disorder, not elsewhere classified   . Elevated blood pressure reading without diagnosis of hypertension   . Migraine  without aura, without mention of intractable migraine without mention of status migrainosus   . Pure hypercholesterolemia   . Unspecified urinary incontinence     Past Surgical History:  Procedure Laterality Date  . ABDOMINAL HYSTERECTOMY    . TONSILLECTOMY AND ADENOIDECTOMY      Social History   Social History  . Marital status: Married    Spouse name: N/A  . Number of children: N/A  . Years of education: N/A   Occupational History  . 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 Smoking status: Never Smoker  . Smokeless tobacco: Never Used  . Alcohol use No  . Drug use: No  . Sexual activity: Not on file   Other Topics Concern  . Not on file   Social History Narrative   Regular exercise-yes 3 days a week   Diet: healthy    Family History  Problem Relation Age of Onset  . Aneurysm Father   . Depression Mother   . Lupus Mother   . Sjogren's syndrome Mother   . Breast cancer Maternal Aunt     Allergies  Allergen Reactions  . Milk-Related Compounds   . Peanut-Containing Drug Products     Medication list reviewed and updated in full in [facility].  GEN: No fevers, chills. Nontoxic. Primarily MSK c/o today. MSK: Detailed in the HPI GI: tolerating PO intake without difficulty Neuro: No numbness, parasthesias, or tingling associated. Otherwise the pertinent positives of the ROS are noted above.  Objective:   BP 108/68 (BP Location: Left Arm, Patient Position: Sitting, Cuff Size: Normal)   Pulse 85   Temp 98.4 F (36.9 C) (Oral)   Ht 5' 6.5" (1.689 m)   Wt 241 lb (109.3 kg)   SpO2 96%   BMI 38.32 kg/m    GEN: WDWN, NAD, Non-toxic, Alert & Oriented x 3 HEENT: Atraumatic, Normocephalic.  Ears and Nose: No external deformity. EXTR: No clubbing/cyanosis/edema NEURO: Normal gait.  PSYCH: Normally interactive. Conversant. Not depressed or anxious appearing.  Calm demeanor.   FEET: L Echymosis: no Edema: no ROM:  full LE B Gait: heel toe, non-antalgic MT pain: no Callus pattern: none Lateral Mall: NT Medial Mall: NT Talus: NT Navicular: NT Cuboid: NT Calcaneous: NT Metatarsals: 4th MT shaft ttp 5th MT: NT Phalanges: NT Achilles: NT Plantar Fascia: NT Fat Pad: NT Peroneals: NT Post Tib: NT Great Toe: Nml motion Ant Drawer: neg ATFL: NT CFL: NT Deltoid: NT Other foot breakdown: none Long arch: preserved Transverse arch: preserved Hindfoot breakdown: none Sensation: intact   Radiology: Dg Foot Complete Left  Result Date: 06/18/2017 CLINICAL DATA:  Pain in the left foot, evaluate for stress fracture EXAM: LEFT FOOT - COMPLETE 3+ VIEW COMPARISON:  None. FINDINGS: Tarsal-metatarsal alignment is normal. Joint spaces appear normal. No fracture is seen. No erosion is noted. There is no evidence of periosteal reaction. IMPRESSION: Negative. Electronically Signed   By: Dwyane Dee M.D.   On: 06/18/2017 16:19     Assessment and Plan:   Left foot pain - Plan: DG Foot Complete Left  The patient has pain directly on the fourth metatarsal shaft with palpation, and most likely this represents a fourth metatarsal stress reaction secondary to activity.  I placed her in a postoperative shoe, and we will recheck her in one month.  No fx on plain films.  Follow-up: Return in about 1 month (around 07/19/2017).  Meds ordered this encounter  Medications  . diclofenac sodium (VOLTAREN) 1 % GEL    Sig: APPLY (2G) BY TOPICAL ROUTE 4 TIMES EVERY DAY TO THE AFFECTED AREA(S)    Refill:  2   There are no discontinued medications. Orders Placed This Encounter  Procedures  . DG Foot Complete Left    Signed,  Gryffin Altice T. Ivan Maskell, MD   Allergies as of 06/18/2017      Reactions   Milk-related Compounds    Peanut-containing Drug Products       Medication List       Accurate as of 06/18/17 11:59 PM. Always use your most recent med list.          albuterol 108 (90 Base) MCG/ACT  inhaler Commonly known as:  PROVENTIL HFA;VENTOLIN HFA Inhale 2 puffs into the lungs every 6 (six) hours as needed for wheezing or shortness of breath.   desonide 0.05 % gel Commonly known as:  DESONATE Apply topically 2 (two) times daily.   diclofenac sodium 1 % Gel Commonly known as:  VOLTAREN APPLY (2G) BY TOPICAL ROUTE 4 TIMES EVERY DAY TO THE AFFECTED AREA(S)   estradiol 0.05 MG/24HR patch Commonly known as:  VIVELLE-DOT PLACE 1 PATCH ONTO THE SKIN TWICE A WEEK   metroNIDAZOLE 1 % gel Commonly known as:  METROGEL Apply topically daily.   mometasone 50 MCG/ACT nasal spray Commonly known as:  NASONEX Place 2 sprays into the nose daily.   omeprazole 20 MG capsule Commonly known as:  PRILOSEC TAKE 1 CAPSULE (20 MG TOTAL) BY MOUTH DAILY.  SUMAtriptan 50 MG tablet Commonly known as:  IMITREX TAKE 1 TABLET (50 MG TOTAL) BY MOUTH EVERY 2 (TWO) HOURS AS NEEDED FOR MIGRAINE.   venlafaxine XR 37.5 MG 24 hr capsule Commonly known as:  EFFEXOR-XR TAKE ONE CAPSULE BY MOUTH EVERY DAY

## 2017-06-24 ENCOUNTER — Other Ambulatory Visit: Payer: Self-pay | Admitting: Family Medicine

## 2017-07-01 ENCOUNTER — Ambulatory Visit (INDEPENDENT_AMBULATORY_CARE_PROVIDER_SITE_OTHER): Payer: Managed Care, Other (non HMO) | Admitting: Podiatry

## 2017-07-01 ENCOUNTER — Encounter: Payer: Self-pay | Admitting: Podiatry

## 2017-07-01 ENCOUNTER — Ambulatory Visit: Payer: Managed Care, Other (non HMO)

## 2017-07-01 DIAGNOSIS — M79672 Pain in left foot: Secondary | ICD-10-CM

## 2017-07-01 DIAGNOSIS — M7752 Other enthesopathy of left foot: Secondary | ICD-10-CM

## 2017-07-01 DIAGNOSIS — M21622 Bunionette of left foot: Secondary | ICD-10-CM | POA: Diagnosis not present

## 2017-07-01 NOTE — Progress Notes (Signed)
   Subjective:    Patient ID: Mary Keith, female    DOB: [birth year], 41 y.o.   MRN: [id number]  HPI she presents today complaining of painful fifth metatarsophalangeal joint left foot times past 4-5 weeks. She denies any injury that she could possibly recall. She was seen by her primary care provider to 3 weeks into this radiographs are taken and states that he may see a stress fracture. He placed her in a Dar[employer] shoe. She states that her foot is has not changed for the better or the worse.    Review of Systems  All other systems reviewed and are negative.      Objective:   Physical Exam: Vital signs are stable alert and oriented 3. Pulses are palpable. Deep tendon reflexes are intact muscle strength is normal symmetrical bilateral. Orthopedic evaluation demonstrates mild tailor's bunion deformity bilateral radiographs confirm this. There is bogginess under knee fifth metatarsal or the likely bursitis. Radiographs demonstrate a lateral deviation of the fifth metatarsal with increase in the fourth intermetatarsal space.        Assessment & Plan:  Physical deformity with bursitis.  Plan: I expressed to her that she needed purchase in a parachuted wider than her forefoot. I also injected the area today with dexamethasone and Kenalog local anesthetic. Follow-up with me as needed.

## 2017-07-29 ENCOUNTER — Ambulatory Visit: Payer: Managed Care, Other (non HMO) | Admitting: Family Medicine

## 2017-08-17 ENCOUNTER — Other Ambulatory Visit: Payer: Self-pay | Admitting: Family Medicine

## 2017-10-07 ENCOUNTER — Other Ambulatory Visit: Payer: Self-pay | Admitting: Family Medicine

## 2017-12-01 ENCOUNTER — Encounter: Payer: Self-pay | Admitting: Family Medicine

## 2018-01-06 ENCOUNTER — Encounter: Payer: Self-pay | Admitting: Family Medicine

## 2018-02-13 ENCOUNTER — Other Ambulatory Visit: Payer: Self-pay | Admitting: Family Medicine

## 2018-03-19 ENCOUNTER — Other Ambulatory Visit: Payer: Self-pay | Admitting: Family Medicine

## 2018-04-01 ENCOUNTER — Other Ambulatory Visit: Payer: Self-pay | Admitting: Family Medicine

## 2018-04-07 ENCOUNTER — Other Ambulatory Visit: Payer: Self-pay | Admitting: Family Medicine

## 2018-04-07 DIAGNOSIS — Z1231 Encounter for screening mammogram for malignant neoplasm of breast: Secondary | ICD-10-CM

## 2018-04-28 ENCOUNTER — Ambulatory Visit
Admission: RE | Admit: 2018-04-28 | Discharge: 2018-04-28 | Disposition: A | Discharge status: Home / Self Care | Payer: Managed Care, Other (non HMO) | Source: Ambulatory Visit | Attending: Family Medicine | Admitting: Family Medicine

## 2018-04-28 DIAGNOSIS — Z1231 Encounter for screening mammogram for malignant neoplasm of breast: Secondary | ICD-10-CM

## 2018-05-17 ENCOUNTER — Other Ambulatory Visit: Payer: Self-pay | Admitting: Family Medicine

## 2018-05-27 ENCOUNTER — Telehealth: Payer: Self-pay | Admitting: Family Medicine

## 2018-05-27 DIAGNOSIS — E7800 Pure hypercholesterolemia, unspecified: Secondary | ICD-10-CM

## 2018-05-27 NOTE — Telephone Encounter (Signed)
-----   Message from Terri J Walsh sent at 05/26/2018 10:21 AM EDT ----- Regarding: Lab orders for Monday, 7.29.19 Patient is scheduled for CPX labs, please order future labs, Thanks , Terri  

## 2018-05-31 ENCOUNTER — Other Ambulatory Visit (INDEPENDENT_AMBULATORY_CARE_PROVIDER_SITE_OTHER): Payer: Managed Care, Other (non HMO)

## 2018-05-31 DIAGNOSIS — E7800 Pure hypercholesterolemia, unspecified: Secondary | ICD-10-CM | POA: Diagnosis not present

## 2018-05-31 LAB — COMPREHENSIVE METABOLIC PANEL
ALT: 36 U/L — ABNORMAL HIGH (ref 0–35)
AST: 31 U/L (ref 0–37)
Albumin: 4.2 g/dL (ref 3.5–5.2)
Alkaline Phosphatase: 60 U/L (ref 39–117)
BUN: 14 mg/dL (ref 6–23)
CALCIUM: 10 mg/dL (ref 8.4–10.5)
CHLORIDE: 99 meq/L (ref 96–112)
CO2: 28 mEq/L (ref 19–32)
Creatinine, Ser: 1.08 mg/dL (ref 0.40–1.20)
GFR: 59.08 mL/min — AB (ref >60.00)
Glucose, Bld: 88 mg/dL (ref 70–99)
POTASSIUM: 4.6 meq/L (ref 3.5–5.1)
Sodium: 136 mEq/L (ref 135–145)
Total Bilirubin: 0.5 mg/dL (ref 0.2–1.2)
Total Protein: 7.3 g/dL (ref 6.0–8.3)

## 2018-05-31 LAB — LIPID PANEL
CHOL/HDL RATIO: 3
Cholesterol: 202 mg/dL — ABNORMAL HIGH (ref 0–200)
HDL: 72.1 mg/dL (ref >39.00)
LDL CALC: 109 mg/dL — AB (ref 0–99)
NonHDL: 129.95
TRIGLYCERIDES: 103 mg/dL (ref 0.0–149.0)
VLDL: 20.6 mg/dL (ref 0.0–40.0)

## 2018-06-04 ENCOUNTER — Encounter: Payer: Managed Care, Other (non HMO) | Admitting: Family Medicine

## 2018-06-12 ENCOUNTER — Other Ambulatory Visit: Payer: Self-pay | Admitting: Family Medicine

## 2018-06-15 ENCOUNTER — Ambulatory Visit (INDEPENDENT_AMBULATORY_CARE_PROVIDER_SITE_OTHER): Payer: Managed Care, Other (non HMO) | Admitting: Family Medicine

## 2018-06-15 ENCOUNTER — Encounter: Payer: Self-pay | Admitting: Family Medicine

## 2018-06-15 VITALS — BP 110/80 | HR 74 | Temp 98.5°F | Ht 66.5 in | Wt 206.8 lb

## 2018-06-15 DIAGNOSIS — G43009 Migraine without aura, not intractable, without status migrainosus: Secondary | ICD-10-CM

## 2018-06-15 DIAGNOSIS — R5383 Other fatigue: Secondary | ICD-10-CM

## 2018-06-15 DIAGNOSIS — R21 Rash and other nonspecific skin eruption: Secondary | ICD-10-CM | POA: Diagnosis not present

## 2018-06-15 DIAGNOSIS — F331 Major depressive disorder, recurrent, moderate: Secondary | ICD-10-CM

## 2018-06-15 DIAGNOSIS — Z0000 Encounter for general adult medical examination without abnormal findings: Secondary | ICD-10-CM

## 2018-06-15 DIAGNOSIS — E7800 Pure hypercholesterolemia, unspecified: Secondary | ICD-10-CM

## 2018-06-15 LAB — CBC WITH DIFFERENTIAL/PLATELET
BASOS PCT: 0.9 % (ref 0.0–3.0)
Basophils Absolute: 0.1 10*3/uL (ref 0.0–0.1)
EOS ABS: 0.2 10*3/uL (ref 0.0–0.7)
Eosinophils Relative: 2.7 % (ref 0.0–5.0)
HEMATOCRIT: 44.8 % (ref 36.0–46.0)
HEMOGLOBIN: 15.1 g/dL — AB (ref 12.0–15.0)
Lymphocytes Relative: 27.8 % (ref 12.0–46.0)
Lymphs Abs: 2 10*3/uL (ref 0.7–4.0)
MCHC: 33.7 g/dL (ref 30.0–36.0)
MCV: 94 fl (ref 78.0–100.0)
MONO ABS: 0.5 10*3/uL (ref 0.1–1.0)
Monocytes Relative: 6.5 % (ref 3.0–12.0)
NEUTROS ABS: 4.4 10*3/uL (ref 1.4–7.7)
Neutrophils Relative %: 62.1 % (ref 43.0–77.0)
Platelets: 320 10*3/uL (ref 150.0–400.0)
RBC: 4.76 Mil/uL (ref 3.87–5.11)
RDW: 13.1 % (ref 11.5–15.5)
WBC: 7 10*3/uL (ref 4.0–10.5)

## 2018-06-15 LAB — VITAMIN B12: Vitamin B-12: 542 pg/mL (ref 211–911)

## 2018-06-15 LAB — SEDIMENTATION RATE: SED RATE: 13 mm/h (ref 0–20)

## 2018-06-15 LAB — T4, FREE: FREE T4: 0.8 ng/dL (ref 0.60–1.60)

## 2018-06-15 LAB — VITAMIN D 25 HYDROXY (VIT D DEFICIENCY, FRACTURES): VITD: 40.3 ng/mL (ref 30.00–100.00)

## 2018-06-15 LAB — TSH: TSH: 3.42 u[IU]/mL (ref 0.35–4.50)

## 2018-06-15 LAB — T3, FREE: T3, Free: 3.8 pg/mL (ref 2.3–4.2)

## 2018-06-15 NOTE — Assessment & Plan Note (Signed)
Appears most like rosacea, but will eval with ANA and sed rate. If still concern.. Refer back to Derm for further eval.

## 2018-06-15 NOTE — Assessment & Plan Note (Signed)
Eval with labs. 

## 2018-06-15 NOTE — Patient Instructions (Addendum)
Please stop at the lab to have labs drawn. Keep up the great work up with healthy eating and regular exercise.

## 2018-06-15 NOTE — Assessment & Plan Note (Signed)
Stable control on venlafaxine 

## 2018-06-15 NOTE — Assessment & Plan Note (Signed)
Good contro on no med. Encouraged exercise, weight loss, healthy eating habits.

## 2018-06-15 NOTE — Progress Notes (Signed)
Subjective:    Patient ID: Mary Keith, female    DOB: [birth year], 42 y.o.   MRN: [id number]  HPI   The patient is here for annual wellness exam and preventative care.    She has lost 35 lbs in last year!  She has been very tired in last month.  No joint swelling , no redness. No other rash than below.   She has noted redness across face, present for 4 years..  She always thought it was rosacea, but her mother brought up lupus issue. She has seen derm in past.  Mother has lupus, RA.   Elevated Cholesterol:    At goal .. Improved from last year. Lab Results  Component Value Date   CHOL 202 (H) 05/31/2018   HDL 72.10 05/31/2018   LDLCALC 109 (H) 05/31/2018   TRIG 103.0 05/31/2018   CHOLHDL 3 05/31/2018  Using medications without problems: Muscle aches:  Diet compliance:  Healthy lifestyle. Exercise: Walking 2-3 days a week, and personal trainer 2 times a week. Other complaints:   Common migraine:  Occurring now  One a week in last month.. Had been occurring 2 in last year. Imitrex and naproxsyn treats acutely well.  Hypothyroidism  Due for re-eval. Lab Results  Component Value Date   TSH 3.76 05/22/2017    Asthma mild intermittent: stable control if keeping allergies controlled. Using albuterol prn.     S/P TAH: on estradiol patch.  MMD:  Good control on low dose venlafaxine daily.    [facility]  PHQ-2 Total Score  0     Social History /Family History/Past Medical History reviewed in detail and updated in EMR if needed. Blood pressure 110/80, pulse 74, temperature 98.5 F (36.9 C), temperature source Oral, height 5' 6.5" (1.689 m), weight 206 lb 12 oz (93.8 kg). Body mass index is 32.87 kg/m. Wt Readings from Last 3 Encounters:  06/15/18 206 lb 12 oz (93.8 kg)  06/18/17 241 lb (109.3 kg)  05/26/17 234 lb 8 oz (106.4 kg)   Review of Systems  Constitutional: Positive for fatigue. Negative for fever.    HENT: Negative for congestion.   Eyes: Negative for pain.  Respiratory: Negative for cough and shortness of breath.   Cardiovascular: Negative for chest pain, palpitations and leg swelling.  Gastrointestinal: Negative for abdominal pain.  Genitourinary: Negative for dysuria and vaginal bleeding.  Musculoskeletal: Negative for back pain.  Neurological: Negative for syncope, light-headedness and headaches.  Psychiatric/Behavioral: Negative for dysphoric mood.       Objective:   Physical Exam  Constitutional: Vital signs are normal. She appears well-developed and well-nourished. She is cooperative.  Non-toxic appearance. She does not appear ill. No distress.  HENT:  Head: Normocephalic.  Right Ear: Hearing, tympanic membrane, external ear and ear canal normal.  Left Ear: Hearing, tympanic membrane, external ear and ear canal normal.  Nose: Nose normal.  Eyes: Pupils are equal, round, and reactive to light. Conjunctivae, EOM and lids are normal. Lids are everted and swept, no foreign bodies found.  Neck: Trachea normal and normal range of motion. Neck supple. Carotid bruit is not present. No thyroid mass and no thyromegaly present.  Cardiovascular: Normal rate, regular rhythm, S1 normal, S2 normal, normal heart sounds and intact distal pulses. Exam reveals no gallop.  No murmur heard. Pulmonary/Chest: Effort normal and breath sounds normal. No respiratory distress. She has no wheezes. She has no rhonchi. She has no rales.  Abdominal: Soft. Normal appearance and bowel sounds are normal. She exhibits no distension, no fluid wave, no abdominal bruit and no mass. There is no hepatosplenomegaly. There is no tenderness. There is no rebound, no guarding and no CVA tenderness. No hernia.  Lymphadenopathy:    She has no cervical adenopathy.    She has no axillary adenopathy.  Neurological: She is alert. She has normal strength. No cranial nerve deficit or sensory deficit.  Skin: Skin is warm, dry  and intact. Rash noted.   Erythema bilateral cheek, nose and chin, no pustules but some erythematous bumps on chin.  Psychiatric: Her speech is normal and behavior is normal. Judgment normal. Her mood appears not anxious. Cognition and memory are normal. She does not exhibit a depressed mood.          Assessment & Plan:  The patient's preventative maintenance and recommended screening tests for an annual wellness exam were reviewed in full today. Brought up to date unless services declined.  Counselled on the importance of diet, exercise, and its role in overall health and mortality. The patient's FH and SH was reviewed, including their home life, tobacco status, and drug and alcohol status.   Mammogram: She has dense breasts... nml mammo 04/2018 PAP/ DVE: TAH, no vaginal symptoms. No early family history colon cancer except PGF late 550s. Vaccines: Tdap Uptodate Nonsmoker no ETOH. HIV: refused

## 2018-06-17 LAB — ANA: ANA: NEGATIVE

## 2018-07-30 ENCOUNTER — Ambulatory Visit: Payer: Managed Care, Other (non HMO) | Admitting: Family Medicine

## 2018-07-30 ENCOUNTER — Encounter: Payer: Self-pay | Admitting: Family Medicine

## 2018-07-30 VITALS — BP 112/80 | HR 87 | Temp 98.2°F | Ht 66.5 in | Wt 213.0 lb

## 2018-07-30 DIAGNOSIS — J4520 Mild intermittent asthma, uncomplicated: Secondary | ICD-10-CM

## 2018-07-30 DIAGNOSIS — J069 Acute upper respiratory infection, unspecified: Secondary | ICD-10-CM | POA: Diagnosis not present

## 2018-07-30 DIAGNOSIS — B9789 Other viral agents as the cause of diseases classified elsewhere: Secondary | ICD-10-CM

## 2018-07-30 MED ORDER — GUAIFENESIN-CODEINE 100-10 MG/5ML PO SYRP: 5.0000 mL | ORAL_SOLUTION | Freq: Every evening | ORAL | 0 refills | Status: DC | PRN

## 2018-07-30 MED ORDER — ALBUTEROL SULFATE HFA 108 (90 BASE) MCG/ACT IN AERS: 2.0000 | INHALATION_SPRAY | Freq: Four times a day (QID) | RESPIRATORY_TRACT | 2 refills | Status: DC | PRN

## 2018-07-30 NOTE — Patient Instructions (Signed)
Mucinex DM during the day. Prescription cough suppressant at night,.  Can use albuterol inhaler as needed for wheeze and and shortness of breath 2 puffs every 4-6 hours as needed.  Go to ER with severe shortness of breath.

## 2018-07-30 NOTE — Assessment & Plan Note (Signed)
Symptomatic care 

## 2018-07-30 NOTE — Progress Notes (Signed)
   Subjective:    Patient ID: Mary Keith, female    DOB: 1976/06/26, 42 y.o.   MRN: [id number]  Cough  This is a new problem. The current episode started in the past 7 days. The problem has been unchanged. The problem occurs constantly. The cough is non-productive. Associated symptoms include ear congestion, a fever, headaches, nasal congestion, a sore throat and wheezing. Pertinent negatives include no chills, ear pain, myalgias, postnasal drip, rash or shortness of breath. Associated symptoms comments: Chest congestion  low grade temp 99.6 F. The symptoms are aggravated by lying down. Risk factors: nonsmoker. She has tried OTC cough suppressant for the symptoms. The treatment provided mild relief. Her past medical history is significant for asthma and environmental allergies. There is no history of COPD.   Hx of mild intermittent asthma Blood pressure 112/80, pulse 87, temperature 98.2 F (36.8 C), temperature source Oral, height 5' 6.5" (1.689 m), weight 213 lb (96.6 kg).  Review of Systems  Constitutional: Positive for fever. Negative for chills.  HENT: Positive for sore throat. Negative for ear pain and postnasal drip.   Respiratory: Positive for cough and wheezing. Negative for shortness of breath.   Musculoskeletal: Negative for myalgias.  Skin: Negative for rash.  Allergic/Immunologic: Positive for environmental allergies.  Neurological: Positive for headaches.       Objective:   Physical Exam  Constitutional: Vital signs are normal. She appears well-developed and well-nourished. She is cooperative.  Non-toxic appearance. She does not appear ill. No distress.  HENT:  Head: Normocephalic.  Right Ear: Hearing, tympanic membrane, external ear and ear canal normal. Tympanic membrane is not erythematous, not retracted and not bulging.  Left Ear: Hearing, tympanic membrane, external ear and ear canal normal. Tympanic membrane is not erythematous, not retracted and not bulging.    Nose: Mucosal edema and rhinorrhea present. Right sinus exhibits no maxillary sinus tenderness and no frontal sinus tenderness. Left sinus exhibits no maxillary sinus tenderness and no frontal sinus tenderness.  Mouth/Throat: Uvula is midline, oropharynx is clear and moist and mucous membranes are normal.  Eyes: Pupils are equal, round, and reactive to light. Conjunctivae, EOM and lids are normal. Lids are everted and swept, no foreign bodies found.  Neck: Trachea normal and normal range of motion. Neck supple. Carotid bruit is not present. No thyroid mass and no thyromegaly present.  Cardiovascular: Normal rate, regular rhythm, S1 normal, S2 normal, normal heart sounds, intact distal pulses and normal pulses. Exam reveals no gallop and no friction rub.  No murmur heard. Pulmonary/Chest: Effort normal and breath sounds normal. No tachypnea. No respiratory distress. She has no decreased breath sounds. She has no wheezes. She has no rhonchi. She has no rales.  Neurological: She is alert.  Skin: Skin is warm, dry and intact. No rash noted.  Psychiatric: Her speech is normal and behavior is normal. Judgment normal. Her mood appears not anxious. Cognition and memory are normal. She does not exhibit a depressed mood.          Assessment & Plan:

## 2018-07-30 NOTE — Assessment & Plan Note (Signed)
No current flare. Given albuterol inhaler to use prn.

## 2018-08-05 MED ORDER — PREDNISONE 20 MG PO TABS: ORAL_TABLET | ORAL | 0 refills | Status: DC

## 2018-08-06 ENCOUNTER — Other Ambulatory Visit: Payer: Self-pay | Admitting: Family Medicine

## 2018-08-06 MED ORDER — PREDNISONE 20 MG PO TABS: ORAL_TABLET | ORAL | 0 refills | Status: DC

## 2018-08-06 NOTE — Progress Notes (Signed)
rx resent  

## 2018-08-18 ENCOUNTER — Other Ambulatory Visit: Payer: Self-pay | Admitting: Family Medicine

## 2018-08-24 ENCOUNTER — Other Ambulatory Visit: Payer: Self-pay | Admitting: Family Medicine

## 2018-09-10 ENCOUNTER — Other Ambulatory Visit: Payer: Self-pay | Admitting: Family Medicine

## 2018-09-22 MED ORDER — CYCLOBENZAPRINE HCL 10 MG PO TABS: 5.0000 mg | ORAL_TABLET | Freq: Every evening | ORAL | 0 refills | Status: DC | PRN

## 2018-11-22 ENCOUNTER — Other Ambulatory Visit: Payer: Self-pay | Admitting: Family Medicine

## 2018-11-23 MED ORDER — MOMETASONE FUROATE 50 MCG/ACT NA SUSP: 2.0000 | Freq: Every day | NASAL | 5 refills | Status: DC

## 2018-12-01 ENCOUNTER — Encounter: Payer: Self-pay | Admitting: Family Medicine

## 2018-12-01 ENCOUNTER — Ambulatory Visit: Payer: Managed Care, Other (non HMO) | Admitting: Family Medicine

## 2018-12-01 VITALS — BP 124/84 | HR 81 | Temp 98.2°F | Ht 66.5 in | Wt 223.5 lb

## 2018-12-01 DIAGNOSIS — J019 Acute sinusitis, unspecified: Secondary | ICD-10-CM

## 2018-12-01 DIAGNOSIS — J4520 Mild intermittent asthma, uncomplicated: Secondary | ICD-10-CM

## 2018-12-01 MED ORDER — FLUCONAZOLE 150 MG PO TABS: 150.0000 mg | ORAL_TABLET | Freq: Once | ORAL | 0 refills | Status: AC

## 2018-12-01 MED ORDER — AMOXICILLIN-POT CLAVULANATE 875-125 MG PO TABS: 1.0000 | ORAL_TABLET | Freq: Two times a day (BID) | ORAL | 0 refills | Status: AC

## 2018-12-01 NOTE — Assessment & Plan Note (Signed)
Not currently in active flare. Has albuterol to use PRN

## 2018-12-01 NOTE — Patient Instructions (Signed)
I think you have bacterial sinus infection. Take medicine as prescribed: augmentin 10 day course.  Push fluids and plenty of rest. Nasal saline irrigation or neti pot to help drain sinuses. Continue nasonex, albuterol.  May continue nyquil and ibuprofen.  May use plain mucinex (guaifenesin) with plenty of fluid to help mobilize mucous. Please let us know if fever >101.5, trouble opening/closing mouth, difficulty swallowing, or worsening instead of improving as expected.

## 2018-12-01 NOTE — Progress Notes (Signed)
BP 124/84 (BP Location: Left Arm, Patient Position: Sitting, Cuff Size: Large)   Pulse 81   Temp 98.2 F (36.8 C) (Oral)   Ht 5' 6.5" (1.689 m)   Wt 223 lb 8 oz (101.4 kg)   SpO2 98%   BMI 35.53 kg/m    CC: cough Subjective:    Patient ID: Mary Keith, female    DOB: October 29, 1976, 43 y.o.   MRN: [id number]  HPI: Mary KidaMisty D Doe is a 43 y.o. female presenting on 12/01/2018 for Cough (C/o cough, rattling in upper chest, HA, sinus pressure, fever- max 100.4, diarrhea and nausea. Sxs started 1 wk ago. Tried Nyquil, Dayquil and ibuprofen. )   1 wk h/o URI symptoms - chest congestion, rattling cough with chest tightness, dry sore throat, facial pain/pressure, nausea/diarrhea. Head > chest congestion. Tmax 100.4 over weekend. Last 2 days feeling better, then today symptoms acutely worsened. R tooth ache, ST.   No chills, ear pain, PNDrainage, dyspnea or wheezing.   Treating with rest, dayquil and nyquil, albuterol inhaler, ibuprofen.  She regularly takes nasonex - has increased to 2 squirts per nostril.  Known asthma - has recently needed albuterol inhaler but usually well controlled. Coworkers sick.  Non smoker She did receive flu shot this year.      Relevant past medical, surgical, family and social history reviewed and updated as indicated. Interim medical history since our last visit reviewed. Allergies and medications reviewed and updated. Outpatient Medications Prior to Visit  Medication Sig Dispense Refill  . albuterol (PROVENTIL HFA;VENTOLIN HFA) 108 (90 Base) MCG/ACT inhaler Inhale 2 puffs into the lungs every 6 (six) hours as needed for wheezing or shortness of breath. 1 Inhaler 2  . desonide (DESONATE) 0.05 % gel Apply topically 2 (two) times daily. (Patient taking differently: Apply topically 2 (two) times daily. As needed) 60 g 0  . estradiol (VIVELLE-DOT) 0.05 MG/24HR patch PLACE 1 PATCH ONTO THE SKIN TWICE A WEEK 24 patch 2  . mometasone (NASONEX) 50 MCG/ACT nasal  spray Place 2 sprays into the nose daily. 17 g 5  . SUMAtriptan (IMITREX) 50 MG tablet TAKE 1 TABLET (50 MG TOTAL) BY MOUTH EVERY 2 (TWO) HOURS AS NEEDED FOR MIGRAINE. 9 tablet 2  . venlafaxine XR (EFFEXOR-XR) 37.5 MG 24 hr capsule TAKE 1 CAPSULE BY MOUTH ONCE DAILY 90 capsule 1  . cyclobenzaprine (FLEXERIL) 10 MG tablet Take 0.5-1 tablets (5-10 mg total) by mouth at bedtime as needed for muscle spasms. 15 tablet 0  . guaiFENesin-codeine (ROBITUSSIN AC) 100-10 MG/5ML syrup Take 5-10 mLs by mouth at bedtime as needed for cough. 180 mL 0  . predniSONE (DELTASONE) 20 MG tablet 3 tabs by mouth daily x 3 days, then 2 tabs by mouth daily x 2 days then 1 tab by mouth daily x 2 days 15 tablet 0   No facility-administered medications prior to visit.      Per HPI unless specifically indicated in ROS section below Review of Systems Objective:    BP 124/84 (BP Location: Left Arm, Patient Position: Sitting, Cuff Size: Large)   Pulse 81   Temp 98.2 F (36.8 C) (Oral)   Ht 5' 6.5" (1.689 m)   Wt 223 lb 8 oz (101.4 kg)   SpO2 98%   BMI 35.53 kg/m   Wt Readings from Last 3 Encounters:  12/01/18 223 lb 8 oz (101.4 kg)  07/30/18 213 lb (96.6 kg)  06/15/18 206 lb 12 oz (93.8 kg)    Physical Exam  Vitals signs and nursing note reviewed.  Constitutional:      General: She is not in acute distress.    Appearance: Normal appearance. She is well-developed.  HENT:     Head: Normocephalic and atraumatic.     Right Ear: Hearing, tympanic membrane, ear canal and external ear normal.     Left Ear: Hearing, tympanic membrane, ear canal and external ear normal.     Nose: Nasal tenderness, mucosal edema, congestion and rhinorrhea present.     Right Sinus: Maxillary sinus tenderness (predominantly) and frontal sinus tenderness present.     Left Sinus: Maxillary sinus tenderness present. No frontal sinus tenderness.     Mouth/Throat:     Mouth: Mucous membranes are moist.     Pharynx: Uvula midline. Posterior  oropharyngeal erythema present. No oropharyngeal exudate.     Tonsils: No tonsillar abscesses.  Eyes:     General: No scleral icterus.    Conjunctiva/sclera: Conjunctivae normal.     Pupils: Pupils are equal, round, and reactive to light.  Neck:     Musculoskeletal: Normal range of motion and neck supple.  Cardiovascular:     Rate and Rhythm: Normal rate and regular rhythm.     Pulses: Normal pulses.     Heart sounds: Normal heart sounds. No murmur.  Pulmonary:     Effort: Pulmonary effort is normal. No respiratory distress.     Breath sounds: Normal breath sounds. No wheezing, rhonchi or rales.     Comments: Lungs largely clear, does have rattly cough present Lymphadenopathy:     Cervical: No cervical adenopathy.  Skin:    General: Skin is warm and dry.     Findings: No rash.  Neurological:     Mental Status: She is alert.       Assessment & Plan:   Problem List Items Addressed This Visit    Asthma, mild intermittent    Not currently in active flare. Has albuterol to use PRN      Acute sinusitis - Primary    Anticipate R maxillary bacterial sinusitis given localizing symptoms and acute worsening after initial improvement. Will treat with augmentin course. Further supportive care as per instructions. Pt request diflucan for yeast infection after abx.       Relevant Medications   amoxicillin-clavulanate (AUGMENTIN) 875-125 MG tablet   fluconazole (DIFLUCAN) 150 MG tablet       Meds ordered this encounter  Medications  . amoxicillin-clavulanate (AUGMENTIN) 875-125 MG tablet    Sig: Take 1 tablet by mouth 2 (two) times daily for 10 days.    Dispense:  20 tablet    Refill:  0  . fluconazole (DIFLUCAN) 150 MG tablet    Sig: Take 1 tablet (150 mg total) by mouth once for 1 dose. Repeat in 4 days if needed    Dispense:  2 tablet    Refill:  0   No orders of the defined types were placed in this encounter.   Patient Instructions  I think you have bacterial sinus  infection. Take medicine as prescribed: augmentin 10 day course.  Push fluids and plenty of rest. Nasal saline irrigation or neti pot to help drain sinuses. Continue nasonex, albuterol.  May continue nyquil and ibuprofen.  May use plain mucinex (guaifenesin) with plenty of fluid to help mobilize mucous. Please let us know if fever >101.5, trouble opening/closing mouth, difficulty swallowing, or worsening instead of improving as expected.    Follow up plan: Return if symptoms worsen or fail  to improve.  Ria Bush, MD

## 2018-12-01 NOTE — Assessment & Plan Note (Addendum)
Anticipate R maxillary bacterial sinusitis given localizing symptoms and acute worsening after initial improvement. Will treat with augmentin course. Further supportive care as per instructions. Pt request diflucan for yeast infection after abx.

## 2018-12-17 ENCOUNTER — Encounter: Payer: Self-pay | Admitting: Family Medicine

## 2018-12-17 ENCOUNTER — Ambulatory Visit: Payer: Managed Care, Other (non HMO) | Admitting: Family Medicine

## 2018-12-17 VITALS — BP 120/74 | HR 86 | Temp 98.5°F | Ht 66.5 in | Wt 226.5 lb

## 2018-12-17 DIAGNOSIS — J4520 Mild intermittent asthma, uncomplicated: Secondary | ICD-10-CM | POA: Diagnosis not present

## 2018-12-17 DIAGNOSIS — R05 Cough: Secondary | ICD-10-CM | POA: Diagnosis not present

## 2018-12-17 DIAGNOSIS — R059 Cough, unspecified: Secondary | ICD-10-CM

## 2018-12-17 MED ORDER — PREDNISONE 10 MG PO TABS: ORAL_TABLET | ORAL | 0 refills | Status: DC

## 2018-12-17 NOTE — Patient Instructions (Signed)
Continue nasal saline 3 times daily.  Complete course of prednisone taper.

## 2018-12-17 NOTE — Progress Notes (Signed)
Subjective:    Patient ID: Mary Keith, female    DOB: 07/04/1976, 43 y.o.   MRN: [id number]  Sore Throat   This is a new problem. Associated symptoms include coughing, ear pain, swollen glands and trouble swallowing. Pertinent negatives include no shortness of breath.  Cough  This is a new problem. The problem occurs constantly. The cough is non-productive. Associated symptoms include ear pain and a sore throat. Pertinent negatives include no fever, nasal congestion, postnasal drip, shortness of breath or wheezing. Associated symptoms comments:  Nasal passages very dry.  Fullness in face.. swollen. The symptoms are aggravated by lying down. Risk factors: nonsmoker. She has tried OTC cough suppressant for the symptoms. The treatment provided mild relief. Her past medical history is significant for asthma and environmental allergies.    Treated 2 times for sinus infection in last 2 months. Took Augmentin x 10 days 1/29.  Felt all better then symptoms returned 3 days ago.   mild intermittant asthma.. using albuterol prn... but has not been wheezing  Blood pressure 120/74, pulse 86, temperature 98.5 F (36.9 C), temperature source Oral, height 5' 6.5" (1.689 m), weight 226 lb 8 oz (102.7 kg), SpO2 98 %. Social History /Family History/Past Medical History reviewed in detail and updated in EMR if needed.    Review of Systems  Constitutional: Negative for fever.  HENT: Positive for ear pain, sore throat and trouble swallowing. Negative for postnasal drip.   Respiratory: Positive for cough. Negative for shortness of breath and wheezing.   Allergic/Immunologic: Positive for environmental allergies.       Objective:   Physical Exam Constitutional:      General: She is not in acute distress.    Appearance: She is well-developed. She is not ill-appearing or toxic-appearing.  HENT:     Head: Normocephalic.     Right Ear: Hearing, ear canal and external ear normal. A middle ear effusion  is present. Tympanic membrane is not erythematous, retracted or bulging.     Left Ear: Hearing, ear canal and external ear normal. A middle ear effusion is present. Tympanic membrane is not erythematous, retracted or bulging.     Nose: Mucosal edema and rhinorrhea present.     Right Sinus: No maxillary sinus tenderness or frontal sinus tenderness.     Left Sinus: No maxillary sinus tenderness or frontal sinus tenderness.     Mouth/Throat:     Pharynx: Uvula midline.  Eyes:     General: Lids are normal. Lids are everted, no foreign bodies appreciated.     Conjunctiva/sclera: Conjunctivae normal.     Pupils: Pupils are equal, round, and reactive to light.  Neck:     Musculoskeletal: Normal range of motion and neck supple.     Thyroid: No thyroid mass or thyromegaly.     Vascular: No carotid bruit.     Trachea: Trachea normal.  Cardiovascular:     Rate and Rhythm: Normal rate and regular rhythm.     Pulses: Normal pulses.     Heart sounds: Normal heart sounds, S1 normal and S2 normal. No murmur. No friction rub. No gallop.   Pulmonary:     Effort: Pulmonary effort is normal. No tachypnea or respiratory distress.     Breath sounds: Normal breath sounds. No decreased breath sounds, wheezing, rhonchi or rales.  Skin:    General: Skin is warm and dry.     Findings: No rash.  Neurological:     Mental Status: She is alert.  Psychiatric:        Mood and Affect: Mood is not anxious or depressed.        Speech: Speech normal.        Behavior: Behavior normal. Behavior is cooperative.        Judgment: Judgment normal.           Assessment & Plan:

## 2018-12-23 NOTE — Assessment & Plan Note (Signed)
Continue nasal saline 3 times daily.  Complete course of prednisone taper. 

## 2019-02-21 ENCOUNTER — Other Ambulatory Visit: Payer: Self-pay | Admitting: Family Medicine

## 2019-03-09 ENCOUNTER — Encounter: Payer: Self-pay | Admitting: *Deleted

## 2019-03-30 ENCOUNTER — Other Ambulatory Visit: Payer: Self-pay | Admitting: Family Medicine

## 2019-03-30 DIAGNOSIS — Z1231 Encounter for screening mammogram for malignant neoplasm of breast: Secondary | ICD-10-CM

## 2019-05-03 ENCOUNTER — Ambulatory Visit
Admission: RE | Admit: 2019-05-03 | Discharge: 2019-05-03 | Disposition: A | Discharge status: Home / Self Care | Payer: Managed Care, Other (non HMO) | Source: Ambulatory Visit | Attending: Family Medicine | Admitting: Family Medicine

## 2019-05-03 ENCOUNTER — Other Ambulatory Visit: Payer: Self-pay

## 2019-05-03 DIAGNOSIS — Z1231 Encounter for screening mammogram for malignant neoplasm of breast: Secondary | ICD-10-CM | POA: Insufficient documentation

## 2019-05-14 ENCOUNTER — Telehealth: Payer: Self-pay | Admitting: Family Medicine

## 2019-05-16 NOTE — Telephone Encounter (Signed)
Mary Keith can you please schedule patient for CPE with fasting labs prior. Thank you. Last physical was 06/15/2018

## 2019-05-23 NOTE — Telephone Encounter (Signed)
cpx 9/3 Labs8/31 Pt aware

## 2019-06-23 ENCOUNTER — Encounter: Payer: Self-pay | Admitting: Family Medicine

## 2019-06-23 ENCOUNTER — Ambulatory Visit (INDEPENDENT_AMBULATORY_CARE_PROVIDER_SITE_OTHER): Payer: Managed Care, Other (non HMO) | Admitting: Family Medicine

## 2019-06-23 VITALS — Temp 99.4°F | Ht 65.5 in

## 2019-06-23 DIAGNOSIS — R509 Fever, unspecified: Secondary | ICD-10-CM | POA: Diagnosis not present

## 2019-06-23 NOTE — Assessment & Plan Note (Signed)
Low grade temp along with nausea and headache.. possible coronavirus but more likely migraine or other viral GE. Recommended home isolation and if fever continuing or new diarrhea, respiratory symptoms.Marland Kitchen COVID 19 testing tommorow or Monday.  Symptomatic care. Pt has phenergan she can use.

## 2019-06-23 NOTE — Progress Notes (Signed)
VIRTUAL VISIT Due to national recommendations of social distancing due to [name], a virtual visit is felt to be most appropriate for this patient at this time.   I connected with the patient on 06/23/19 at  4:00 PM EDT by virtual telehealth platform and verified that I am speaking with the correct person using two identifiers.   I discussed the limitations, risks, security and privacy concerns of performing an evaluation and management service by  virtual telehealth platform and the availability of in person appointments. I also discussed with the patient that there may be a patient responsible charge related to this service. The patient expressed understanding and agreed to proceed.  Patient location: Home Provider Location: [place] Participants: Eliezer Lofts and [name]   Chief Complaint  Patient presents with  . Nausea    C/o nausea, fever and HA.  Sxs started 06/21/19.     History of Present Illness:  43 year old previously healthy female presents with new onset  Nausea, low grade fever and headache in last 2 days.  She reports  She woke up with unusual headache associated with nausea ( has history of migraine, improved some with imitrex) Temp today at work 99.42F   Decreased appetite. Food and water tastes bad.  Stomach is noisy. No emesis, no diarrhea, no constipation, no blood in stool.  Headache is better after tylenol.  No cough, no congestion, no ST. No SOB.  COVID 19 screen Works at town of [employer]... wearing masks when interacting. No sick contacts.   The importance of social distancing was discussed today.   Review of Systems  Constitutional: Negative for chills and fever.  HENT: Negative for congestion and ear pain.   Eyes: Negative for pain and redness.  Respiratory: Negative for cough and shortness of breath.   Cardiovascular: Negative for chest pain, palpitations and leg swelling.  Gastrointestinal: Positive for nausea.  Negative for abdominal pain, blood in stool, constipation, diarrhea and vomiting.  Genitourinary: Negative for dysuria.  Musculoskeletal: Negative for falls and myalgias.  Skin: Negative for rash.  Neurological: Negative for dizziness.  Psychiatric/Behavioral: Negative for depression. The patient is not nervous/anxious.       Past Medical History:  Diagnosis Date  . Depressive disorder, not elsewhere classified   . Elevated blood pressure reading without diagnosis of hypertension   . Migraine without aura, without mention of intractable migraine without mention of status migrainosus   . Pure hypercholesterolemia   . Unspecified urinary incontinence     reports that she has never smoked. She has never used smokeless tobacco. She reports that she does not drink alcohol or use drugs.   Current Outpatient Medications:  .  albuterol (PROVENTIL HFA;VENTOLIN HFA) 108 (90 Base) MCG/ACT inhaler, Inhale 2 puffs into the lungs every 6 (six) hours as needed for wheezing or shortness of breath., Disp: 1 Inhaler, Rfl: 2 .  desonide (DESONATE) 0.05 % gel, Apply topically 2 (two) times daily. (Patient taking differently: Apply topically 2 (two) times daily. As needed), Disp: 60 g, Rfl: 0 .  DOTTI 0.05 MG/24HR patch, PLACE 1 PATCH ONTO THE SKIN TWICE A WEEK, Disp: 8 patch, Rfl: 0 .  metroNIDAZOLE (METROGEL) 1 % gel, , Disp: , Rfl:  .  mometasone (NASONEX) 50 MCG/ACT nasal spray, Place 2 sprays into the nose daily., Disp: 17 g, Rfl: 5 .  SUMAtriptan (IMITREX) 50 MG tablet, TAKE ONE TABLET BY MOUTH EVERY 2 HOURS AS NEEDED FOR MIGRAINE, Disp: 9  tablet, Rfl: 2 .  venlafaxine XR (EFFEXOR-XR) 37.5 MG 24 hr capsule, TAKE 1 CAPSULE BY MOUTH ONCE DAILY, Disp: 90 capsule, Rfl: 1   Observations/Objective: Temperature 99.4 F (37.4 C), height 5' 5.5" (1.664 m).  Physical Exam  Physical Exam Constitutional:      General: The patient is not in acute distress. Pulmonary:     Effort: Pulmonary effort is normal.  No respiratory distress.  Neurological:     Mental Status: The patient is alert and oriented to person, place, and time.  Psychiatric:        Mood and Affect: Mood normal.        Behavior: Behavior normal.   Assessment and Plan Low grade fever  Low grade temp along with nausea and headache.. possible coronavirus but more likely migraine or other viral GE. Recommended home isolation and if fever continuing or new diarrhea, respiratory symptoms.Mary Keith. COVID 19 testing tommorow or Monday.  Symptomatic care. Pt has phenergan she can use.     I discussed the assessment and treatment plan with the patient. The patient was provided an opportunity to ask questions and all were answered. The patient agreed with the plan and demonstrated an understanding of the instructions.   The patient was advised to call back or seek an in-person evaluation if the symptoms worsen or if the condition fails to improve as anticipated.     Mary NoraAmy Avryl Roehm, MD

## 2019-07-02 ENCOUNTER — Telehealth: Payer: Self-pay | Admitting: Family Medicine

## 2019-07-02 DIAGNOSIS — E7800 Pure hypercholesterolemia, unspecified: Secondary | ICD-10-CM

## 2019-07-02 NOTE — Telephone Encounter (Signed)
-----   Message from Ellamae Sia sent at 06/28/2019  9:57 AM EDT ----- Regarding: Lab orders for Monday, 8.31.20 Patient is scheduled for CPX labs, please order future labs, Thanks , Karna Christmas

## 2019-07-04 ENCOUNTER — Other Ambulatory Visit: Payer: Self-pay

## 2019-07-04 ENCOUNTER — Other Ambulatory Visit (INDEPENDENT_AMBULATORY_CARE_PROVIDER_SITE_OTHER): Payer: Managed Care, Other (non HMO)

## 2019-07-04 DIAGNOSIS — E7800 Pure hypercholesterolemia, unspecified: Secondary | ICD-10-CM

## 2019-07-04 LAB — COMPREHENSIVE METABOLIC PANEL
ALT: 18 U/L (ref 0–35)
AST: 21 U/L (ref 0–37)
Albumin: 4.2 g/dL (ref 3.5–5.2)
Alkaline Phosphatase: 62 U/L (ref 39–117)
BUN: 13 mg/dL (ref 6–23)
CO2: 28 mEq/L (ref 19–32)
Calcium: 9.4 mg/dL (ref 8.4–10.5)
Chloride: 102 mEq/L (ref 96–112)
Creatinine, Ser: 1.07 mg/dL (ref 0.40–1.20)
GFR: 55.89 mL/min — ABNORMAL LOW (ref >60.00)
Glucose, Bld: 88 mg/dL (ref 70–99)
Potassium: 4.3 mEq/L (ref 3.5–5.1)
Sodium: 137 mEq/L (ref 135–145)
Total Bilirubin: 0.4 mg/dL (ref 0.2–1.2)
Total Protein: 7.1 g/dL (ref 6.0–8.3)

## 2019-07-04 LAB — LIPID PANEL
Cholesterol: 187 mg/dL (ref 0–200)
HDL: 57.2 mg/dL (ref >39.00)
LDL Cholesterol: 105 mg/dL — ABNORMAL HIGH (ref 0–99)
NonHDL: 130
Total CHOL/HDL Ratio: 3
Triglycerides: 123 mg/dL (ref 0.0–149.0)
VLDL: 24.6 mg/dL (ref 0.0–40.0)

## 2019-07-05 NOTE — Progress Notes (Signed)
No critical labs need to be addressed urgently. We will discuss labs in detail at upcoming office visit.   

## 2019-07-07 ENCOUNTER — Encounter: Payer: Managed Care, Other (non HMO) | Admitting: Family Medicine

## 2019-07-07 ENCOUNTER — Encounter: Payer: Self-pay | Admitting: Family Medicine

## 2019-07-07 ENCOUNTER — Ambulatory Visit (INDEPENDENT_AMBULATORY_CARE_PROVIDER_SITE_OTHER): Payer: Managed Care, Other (non HMO) | Admitting: Family Medicine

## 2019-07-07 ENCOUNTER — Other Ambulatory Visit: Payer: Self-pay

## 2019-07-07 VITALS — BP 122/92 | Wt 232.0 lb

## 2019-07-07 DIAGNOSIS — F331 Major depressive disorder, recurrent, moderate: Secondary | ICD-10-CM | POA: Diagnosis not present

## 2019-07-07 DIAGNOSIS — E7800 Pure hypercholesterolemia, unspecified: Secondary | ICD-10-CM | POA: Diagnosis not present

## 2019-07-07 DIAGNOSIS — Z0000 Encounter for general adult medical examination without abnormal findings: Secondary | ICD-10-CM

## 2019-07-07 DIAGNOSIS — K219 Gastro-esophageal reflux disease without esophagitis: Secondary | ICD-10-CM

## 2019-07-07 DIAGNOSIS — J4520 Mild intermittent asthma, uncomplicated: Secondary | ICD-10-CM

## 2019-07-07 DIAGNOSIS — Z5181 Encounter for therapeutic drug level monitoring: Secondary | ICD-10-CM

## 2019-07-07 DIAGNOSIS — Z79890 Hormone replacement therapy (postmenopausal): Secondary | ICD-10-CM

## 2019-07-07 DIAGNOSIS — R030 Elevated blood-pressure reading, without diagnosis of hypertension: Secondary | ICD-10-CM

## 2019-07-07 MED ORDER — ESTRADIOL 0.0375 MG/24HR TD PTTW: 1.0000 | MEDICATED_PATCH | TRANSDERMAL | 12 refills | Status: DC

## 2019-07-07 NOTE — Progress Notes (Signed)
VIRTUAL VISIT Due to national recommendations of social distancing due to [place] 19, a virtual visit is felt to be most appropriate for this patient at this time.   I connected with the patient on 07/07/19 at  8:20 AM EDT by virtual telehealth platform and verified that I am speaking with the correct person using two identifiers.   I discussed the limitations, risks, security and privacy concerns of performing an evaluation and management service by  virtual telehealth platform and the availability of in person appointments. I also discussed with the patient that there may be a patient responsible charge related to this service. The patient expressed understanding and agreed to proceed.  Patient location: Home Provider Location: [facility] [place] Participants: Mary Keith and [name]   Chief Complaint  Patient presents with  . Annual Exam    History of Present Illness: The patient is here for annual wellness exam and preventative care.   Mild congestion at night, mild PND.Marland Kitchen wakes up occ with ST... husband snoring in last several weeks.no apnea spells.  Elevated Cholesterol:LDL at goal. Lab Results  Component Value Date   CHOL 187 07/04/2019   HDL 57.20 07/04/2019   LDLCALC 105 (H) 07/04/2019   TRIG 123.0 07/04/2019   CHOLHDL 3 07/04/2019  Using medications without problems: Muscle aches:  Diet compliance: Exercise:started back, but has not been doping as much during the pandemic. Other complaints:  Hypothyroid  Lab Results  Component Value Date   TSH 3.42 06/15/2018      GAD/MDD:Good control on venlafaxine   Office Visit from 07/07/2019 in [place] at [facility] Total Score  0     GERD: stable control on PPI  Mild intermittent asthma:  Using albuterol rarely. [place] when sick.  COVID 19 screen No recent travel or known exposure to COVID19 The patient denies respiratory symptoms of COVID 19 at this time.  The importance of social distancing  was discussed today.   Review of Systems  Constitutional: Negative for chills and fever.  HENT: Negative for congestion and ear pain.   Eyes: Negative for pain and redness.  Respiratory: Negative for cough and shortness of breath.   Cardiovascular: Negative for chest pain, palpitations and leg swelling.  Gastrointestinal: Negative for abdominal pain, blood in stool, constipation, diarrhea, nausea and vomiting.  Genitourinary: Negative for dysuria.  Musculoskeletal: Negative for falls and myalgias.  Skin: Negative for rash.  Neurological: Negative for dizziness.  Psychiatric/Behavioral: Negative for depression. The patient is not nervous/anxious.       Past Medical History:  Diagnosis Date  . Depressive disorder, not elsewhere classified   . Elevated blood pressure reading without diagnosis of hypertension   . Migraine without aura, without mention of intractable migraine without mention of status migrainosus   . Pure hypercholesterolemia   . Unspecified urinary incontinence     reports that she has never smoked. She has never used smokeless tobacco. She reports that she does not drink alcohol or use drugs.   Current Outpatient Medications:  .  albuterol (PROVENTIL HFA;VENTOLIN HFA) 108 (90 Base) MCG/ACT inhaler, Inhale 2 puffs into the lungs every 6 (six) hours as needed for wheezing or shortness of breath., Disp: 1 Inhaler, Rfl: 2 .  DOTTI 0.05 MG/24HR patch, PLACE 1 PATCH ONTO THE SKIN TWICE A WEEK, Disp: 8 patch, Rfl: 0 .  metroNIDAZOLE (METROGEL) 1 % gel, , Disp: , Rfl:  .  mometasone (NASONEX) 50 MCG/ACT nasal spray, Place 2 sprays into the  nose daily., Disp: 17 g, Rfl: 5 .  omeprazole (PRILOSEC) 20 MG capsule, Take 20 mg by mouth daily., Disp: , Rfl:  .  SUMAtriptan (IMITREX) 50 MG tablet, TAKE ONE TABLET BY MOUTH EVERY 2 HOURS AS NEEDED FOR MIGRAINE, Disp: 9 tablet, Rfl: 2 .  venlafaxine XR (EFFEXOR-XR) 37.5 MG 24 hr capsule, TAKE 1 CAPSULE BY MOUTH ONCE DAILY, Disp: 90  capsule, Rfl: 1   Observations/Objective: Blood pressure (!) 122/92, weight 232 lb (105.2 kg). Checked BP when at work. BP Readings from Last 3 Encounters:  07/07/19 (!) 122/92  12/17/18 120/74  12/01/18 124/84    Physical Exam  Physical Exam Constitutional:      General: The patient is not in acute distress. Pulmonary:     Effort: Pulmonary effort is normal. No respiratory distress.  Neurological:     Mental Status: The patient is alert and oriented to person, place, and time.  Psychiatric:        Mood and Affect: Mood normal.        Behavior: Behavior normal.   Assessment and Plan The patient's preventative maintenance and recommended screening tests for an annual wellness exam were reviewed in full today. Brought up to date unless services declined.  Counselled on the importance of diet, exercise, and its role in overall health and mortality. The patient's FH and SH was reviewed, including their home life, tobacco status, and drug and alcohol status.   Mammogram: She has dense breasts... nml mammo 04/2019 PAP/ DVE: TAH, no vaginal symptoms. No early family history colon cancer except PGF late 3850s. Vaccines: Tdap uptodate, due for flu.. needs egg free.. will do at work Nonsmoker, no ETOH. HIV: refused  Asthma, mild intermittent Stable control.. using albuterol prn,.  ELEVATED BP READING WITHOUT DX HYPERTENSION Follow BPs at home.  GASTROESOPHAGEAL REFLUX DISEASE Stable on PPI prn.  HYPERCHOLESTEROLEMIA Tolerable control, minimal risk factors. No statin indicated  Major depressive disorder, recurrent episode, moderate (HCC) Well controlled. Continue current medication.     I discussed the assessment and treatment plan with the patient. The patient was provided an opportunity to ask questions and all were answered. The patient agreed with the plan and demonstrated an understanding of the instructions.   The patient was advised to call back or seek an in-person  evaluation if the symptoms worsen or if the condition fails to improve as anticipated.     Kerby NoraAmy Ahleah Simko, MD

## 2019-07-07 NOTE — Assessment & Plan Note (Signed)
Stable on PPI prn.

## 2019-07-07 NOTE — Assessment & Plan Note (Signed)
Will wean down gentley overtime to lower dose patches.

## 2019-07-07 NOTE — Assessment & Plan Note (Signed)
Follow BPs at home.

## 2019-07-07 NOTE — Assessment & Plan Note (Signed)
Well controlled. Continue current medication.  

## 2019-07-07 NOTE — Patient Instructions (Addendum)
Increase nasocort to 2 sprays per nostril daily... IF symtpoms not imrpoving add zyrtec or Xyzal at bedtime.  Start regulart 3-5 times a week. Increase water overall.   Preventive Care 60-43 Years Old, Female Preventive care refers to visits with your health care provider and lifestyle choices that can promote health and wellness. This includes:  A yearly physical exam. This may also be called an annual well check.  Regular dental visits and eye exams.  Immunizations.  Screening for certain conditions.  Healthy lifestyle choices, such as eating a healthy diet, getting regular exercise, not using drugs or products that contain nicotine and tobacco, and limiting alcohol use. What can I expect for my preventive care visit? Physical exam Your health care provider will check your:  Height and weight. This may be used to calculate body mass index (BMI), which tells if you are at a healthy weight.  Heart rate and blood pressure.  Skin for abnormal spots. Counseling Your health care provider may ask you questions about your:  Alcohol, tobacco, and drug use.  Emotional well-being.  Home and relationship well-being.  Sexual activity.  Eating habits.  Work and work Statistician.  Method of birth control.  Menstrual cycle.  Pregnancy history. What immunizations do I need?  Influenza (flu) vaccine  This is recommended every year. Tetanus, diphtheria, and pertussis (Tdap) vaccine  You may need a Td booster every 10 years. Varicella (chickenpox) vaccine  You may need this if you have not been vaccinated. Zoster (shingles) vaccine  You may need this after age 27. Measles, mumps, and rubella (MMR) vaccine  You may need at least one dose of MMR if you were born in 1957 or later. You may also need a second dose. Pneumococcal conjugate (PCV13) vaccine  You may need this if you have certain conditions and were not previously vaccinated. Pneumococcal polysaccharide (PPSV23)  vaccine  You may need one or two doses if you smoke cigarettes or if you have certain conditions. Meningococcal conjugate (MenACWY) vaccine  You may need this if you have certain conditions. Hepatitis A vaccine  You may need this if you have certain conditions or if you travel or work in places where you may be exposed to hepatitis A. Hepatitis B vaccine  You may need this if you have certain conditions or if you travel or work in places where you may be exposed to hepatitis B. Haemophilus influenzae type b (Hib) vaccine  You may need this if you have certain conditions. Human papillomavirus (HPV) vaccine  If recommended by your health care provider, you may need three doses over 6 months. You may receive vaccines as individual doses or as more than one vaccine together in one shot (combination vaccines). Talk with your health care provider about the risks and benefits of combination vaccines. What tests do I need? Blood tests  Lipid and cholesterol levels. These may be checked every 5 years, or more frequently if you are over 22 years old.  Hepatitis C test.  Hepatitis B test. Screening  Lung cancer screening. You may have this screening every year starting at age 38 if you have a 30-pack-year history of smoking and currently smoke or have quit within the past 15 years.  Colorectal cancer screening. All adults should have this screening starting at age 59 and continuing until age 20. Your health care provider may recommend screening at age 85 if you are at increased risk. You will have tests every 1-10 years, depending on your results and  the type of screening test.  Diabetes screening. This is done by checking your blood sugar (glucose) after you have not eaten for a while (fasting). You may have this done every 1-3 years.  Mammogram. This may be done every 1-2 years. Talk with your health care provider about when you should start having regular mammograms. This may depend on  whether you have a family history of breast cancer.  BRCA-related cancer screening. This may be done if you have a family history of breast, ovarian, tubal, or peritoneal cancers.  Pelvic exam and Pap test. This may be done every 3 years starting at age 38. Starting at age 28, this may be done every 5 years if you have a Pap test in combination with an HPV test. Other tests  Sexually transmitted disease (STD) testing.  Bone density scan. This is done to screen for osteoporosis. You may have this scan if you are at high risk for osteoporosis. Follow these instructions at home: Eating and drinking  Eat a diet that includes fresh fruits and vegetables, whole grains, lean protein, and low-fat dairy.  Take vitamin and mineral supplements as recommended by your health care provider.  Do not drink alcohol if: ? Your health care provider tells you not to drink. ? You are pregnant, may be pregnant, or are planning to become pregnant.  If you drink alcohol: ? Limit how much you have to 0-1 drink a day. ? Be aware of how much alcohol is in your drink. In the [place] one drink equals one 12 oz bottle of beer (355 mL), one 5 oz glass of wine (148 mL), or one 1 oz glass of hard liquor (44 mL). Lifestyle  Take daily care of your teeth and gums.  Stay active. Exercise for at least 30 minutes on 5 or more days each week.  Do not use any products that contain nicotine or tobacco, such as cigarettes, e-cigarettes, and chewing tobacco. If you need help quitting, ask your health care provider.  If you are sexually active, practice safe sex. Use a condom or other form of birth control (contraception) in order to prevent pregnancy and STIs (sexually transmitted infections).  If told by your health care provider, take low-dose aspirin daily starting at age 32. What's next?  Visit your health care provider once a year for a well check visit.  Ask your health care provider how often you should have your  eyes and teeth checked.  Stay up to date on all vaccines. This information is not intended to replace advice given to you by your health care provider. Make sure you discuss any questions you have with your health care provider. Document Released: 11/16/2015 Document Revised: 07/01/2018 Document Reviewed: 07/01/2018 Elsevier Patient Education  2020 [employer].

## 2019-07-07 NOTE — Assessment & Plan Note (Signed)
Stable control.. using albuterol prn,.

## 2019-07-07 NOTE — Assessment & Plan Note (Signed)
Tolerable control, minimal risk factors. No statin indicated

## 2019-07-15 ENCOUNTER — Other Ambulatory Visit: Payer: Self-pay | Admitting: Family Medicine

## 2019-08-31 ENCOUNTER — Encounter: Payer: Self-pay | Admitting: Podiatry

## 2019-08-31 ENCOUNTER — Ambulatory Visit: Payer: Managed Care, Other (non HMO) | Admitting: Podiatry

## 2019-08-31 ENCOUNTER — Other Ambulatory Visit: Payer: Self-pay

## 2019-08-31 ENCOUNTER — Ambulatory Visit (INDEPENDENT_AMBULATORY_CARE_PROVIDER_SITE_OTHER): Payer: Managed Care, Other (non HMO)

## 2019-08-31 DIAGNOSIS — M722 Plantar fascial fibromatosis: Secondary | ICD-10-CM

## 2019-08-31 MED ORDER — MELOXICAM 15 MG PO TABS: 15.0000 mg | ORAL_TABLET | Freq: Every day | ORAL | 3 refills | Status: DC

## 2019-08-31 MED ORDER — METHYLPREDNISOLONE 4 MG PO TBPK: ORAL_TABLET | ORAL | 0 refills | Status: DC

## 2019-08-31 NOTE — Patient Instructions (Signed)

## 2019-08-31 NOTE — Progress Notes (Signed)
She presents today after having not seen her for a couple of years.  With a chief complaint of pain to her right heel.  She states is been bothering her for the past 2 months mornings are particularly bad she tried a compression sock and ibuprofen but no help.  Objective: Vital signs are stable alert and oriented x3.  Pulses are palpable.  Neurologic sensorium is intact.  Deep tendon reflexes are intact.  Muscle strength is normal symmetrical.  She has pain on palpation medial calcaneal tubercle of the right heel.  Radiographs taken today demonstrate a soft tissue increase in density limited spurring is noted.  Assessment: Acute plantar fasciitis right.  Plan: We discussed etiology pathology conservative versus surgical therapies.  At this point I went ahead and injected her right heel with 20 mg of Kenalog 5 mg Marcaine after sterile Betadine skin prep.  Tolerated procedure well without complications.  Start her on Medrol Dosepak to be followed by meloxicam.  Placed her in a plantar fascial brace and a night splint.  Discussed appropriate shoe gear stretching exercises ice therapy and shoe gear modifications.  We will follow-up with her in 1 month.

## 2019-09-14 ENCOUNTER — Telehealth: Payer: Self-pay

## 2019-09-14 MED ORDER — ELETRIPTAN HYDROBROMIDE 40 MG PO TABS: 40.0000 mg | ORAL_TABLET | ORAL | 0 refills | Status: DC | PRN

## 2019-09-14 MED ORDER — PROMETHAZINE HCL 25 MG PO TABS: 25.0000 mg | ORAL_TABLET | Freq: Three times a day (TID) | ORAL | 0 refills | Status: DC | PRN

## 2019-09-14 NOTE — Telephone Encounter (Signed)
Pt called back; pt notified as instructed and pt said since her last call she has developed a fever and her husband has been tested for covid 09/14/19 due to exposure to a + covid coworker. Pt said she feels terrible; pt has chills, fever, H/A pain level now is 7-8, neck muscles are killing her, diarrhea since 09/13/19, eyes are red and glassy, nose has been bleeding on and off for 30 days. Last nose bleed was 09/13/19. Fatigue. No other covid symptoms; no travel. Pt does not want to go to [facility] and pt will go now to [facility] drive thru to be tested. Pt said she would appreciate a different med and med for nausea [facility].

## 2019-09-14 NOTE — Telephone Encounter (Signed)
Pt had h/a for 2 days.pt took 2 imitrex on 09/13/19 and after 2nd imitrex had brief relief of h/a. Pt presently taking meloxicam and tylenol with no relief of pain. H/A may be stress related due to passing unexpectedly of pts mom last wk.pt wants to know if there is a different med pt can be taking for H/A. Last annual 07/07/19. Unable to reach pt by phone.Please advise.

## 2019-09-14 NOTE — Telephone Encounter (Signed)
Received fax from [employer] requesting PA on Relpax 40 mg.  PA completed on CoverMyMeds and sent for review.  It can take up to 72 hours for a decision.

## 2019-09-14 NOTE — Telephone Encounter (Signed)
Can come to office for toradol shot.  or we can try a different triptan ( like imitrex but different).  and add a nausea med if associated nausea.

## 2019-09-14 NOTE — Telephone Encounter (Signed)
Let pt know: Sent in rx for phenergan and for relpax, but for viral associated headache.. ibuprofen 800 mg TID likely to help more with headache and body aches with lots of fluids, rest.

## 2019-09-14 NOTE — Telephone Encounter (Signed)
Bahja notified as instructed by telephone.  Patient states understanding.

## 2019-09-15 ENCOUNTER — Other Ambulatory Visit: Payer: Self-pay

## 2019-09-15 DIAGNOSIS — Z20822 Contact with and (suspected) exposure to covid-19: Secondary | ICD-10-CM

## 2019-09-17 LAB — NOVEL CORONAVIRUS, NAA: SARS-CoV-2, NAA: NOT DETECTED

## 2019-09-19 MED ORDER — ELETRIPTAN HYDROBROMIDE 40 MG PO TABS: 40.0000 mg | ORAL_TABLET | ORAL | 0 refills | Status: DC | PRN

## 2019-09-19 NOTE — Telephone Encounter (Signed)
PA denied.  There is a quantity limit of 6 tablets per 30 days.  Rx resent to [facility] for #6 tablets.  FYI to Dr. Diona Browner.  [facility] notified of change via fax.

## 2019-09-19 NOTE — Addendum Note (Signed)
Addended by: Carter Kitten on: 09/19/2019 01:17 PM   Modules accepted: Orders

## 2019-10-05 ENCOUNTER — Other Ambulatory Visit: Payer: Self-pay

## 2019-10-05 ENCOUNTER — Ambulatory Visit (INDEPENDENT_AMBULATORY_CARE_PROVIDER_SITE_OTHER): Payer: Managed Care, Other (non HMO) | Admitting: Podiatry

## 2019-10-05 DIAGNOSIS — M722 Plantar fascial fibromatosis: Secondary | ICD-10-CM | POA: Diagnosis not present

## 2019-10-05 MED ORDER — DICLOFENAC SODIUM 75 MG PO TBEC: 75.0000 mg | DELAYED_RELEASE_TABLET | Freq: Two times a day (BID) | ORAL | 3 refills | Status: DC

## 2019-10-05 NOTE — Progress Notes (Signed)
She presents today for follow-up of her plantar fasciitis states the injection helped a lot but still has some complaints 1 of which is not being able to walk barefoot.  Objective: Vital signs are stable she is alert and oriented x3.  Pulses are palpable.  Still has pain on palpation medial tubercle of the right heel.  Assessment: Plantar fasciitis right.  Plan: Went ahead and reinjected the area today 20 mg Kenalog 5 mg Marcaine point of maximal tenderness.  Also started her on diclofenac 75 mg 1 p.o. twice daily.

## 2019-11-02 ENCOUNTER — Other Ambulatory Visit: Payer: Self-pay

## 2019-11-02 ENCOUNTER — Encounter: Payer: Self-pay | Admitting: Podiatry

## 2019-11-02 ENCOUNTER — Ambulatory Visit: Payer: Managed Care, Other (non HMO) | Admitting: Podiatry

## 2019-11-02 DIAGNOSIS — M722 Plantar fascial fibromatosis: Secondary | ICD-10-CM | POA: Diagnosis not present

## 2019-11-02 NOTE — Progress Notes (Signed)
She presents today for follow-up of her plantar fasciitis right.  States that is doing a lot better but I still have some focal pain.  Objective: Vital signs are stable alert needed x3 she has pinpoint pain at the plantar medial calcaneal tubercle.  On palpation the area is much more soft and is not warm to the touch.  Assessment: Residual plantar fasciitis.  Plan: I injected the area today 20 mg Kenalog 5 mg Marcaine to the point of maximal tenderness.  I will follow-up with her in 6 weeks if necessary.

## 2019-11-18 ENCOUNTER — Other Ambulatory Visit: Payer: Self-pay | Admitting: Family Medicine

## 2019-11-21 NOTE — Telephone Encounter (Signed)
Not on current medication list but per her last office visit she is taking it.  I have left message asking Mary Keith to call me back or send me a MyChart message to verify that she is still taking the venlafaxine.

## 2019-12-14 ENCOUNTER — Ambulatory Visit: Payer: Managed Care, Other (non HMO) | Admitting: Podiatry

## 2019-12-19 ENCOUNTER — Other Ambulatory Visit: Payer: Self-pay | Admitting: Family Medicine

## 2019-12-22 MED ORDER — ESTRADIOL 0.025 MG/24HR TD PTTW: 1.0000 | MEDICATED_PATCH | TRANSDERMAL | 12 refills | Status: DC

## 2020-01-17 ENCOUNTER — Other Ambulatory Visit: Payer: Self-pay | Admitting: Family Medicine

## 2020-03-10 ENCOUNTER — Other Ambulatory Visit: Payer: Self-pay | Admitting: Family Medicine

## 2020-03-12 NOTE — Telephone Encounter (Signed)
Last filled on 01/17/2020 #10 with 0 refill  LOV 07/07/2019 CPE  No future appointments

## 2020-04-09 ENCOUNTER — Other Ambulatory Visit: Payer: Self-pay | Admitting: Family Medicine

## 2020-04-09 DIAGNOSIS — Z1231 Encounter for screening mammogram for malignant neoplasm of breast: Secondary | ICD-10-CM

## 2020-04-10 ENCOUNTER — Other Ambulatory Visit: Payer: Self-pay | Admitting: Family Medicine

## 2020-05-15 ENCOUNTER — Ambulatory Visit
Admission: RE | Admit: 2020-05-15 | Discharge: 2020-05-15 | Disposition: A | Discharge status: Home / Self Care | Payer: Managed Care, Other (non HMO) | Source: Ambulatory Visit | Attending: Family Medicine | Admitting: Family Medicine

## 2020-05-15 DIAGNOSIS — Z1231 Encounter for screening mammogram for malignant neoplasm of breast: Secondary | ICD-10-CM | POA: Diagnosis present

## 2020-06-04 ENCOUNTER — Other Ambulatory Visit: Payer: Self-pay

## 2020-06-04 ENCOUNTER — Ambulatory Visit
Admission: EM | Admit: 2020-06-04 | Discharge: 2020-06-04 | Disposition: A | Discharge status: Home / Self Care | Payer: Managed Care, Other (non HMO) | Attending: Emergency Medicine | Admitting: Emergency Medicine

## 2020-06-04 DIAGNOSIS — J020 Streptococcal pharyngitis: Secondary | ICD-10-CM

## 2020-06-04 LAB — POCT RAPID STREP A (OFFICE): Rapid Strep A Screen: POSITIVE — AB

## 2020-06-04 MED ORDER — PENICILLIN G BENZATHINE 1200000 UNIT/2ML IM SUSP
1.2000 10*6.[IU] | Freq: Once | INTRAMUSCULAR | Status: AC
  Administered 2020-06-04: 1.2 10*6.[IU] via INTRAMUSCULAR

## 2020-06-04 NOTE — Discharge Instructions (Addendum)
You were given an injection of penicillin today.  No additional antibiotic is required at this time.    Take Tylenol or ibuprofen as needed for fever or discomfort.    Your COVID test is pending.  You should self quarantine until the test result is back.    Follow up with your primary care provider if your symptoms are not improving.

## 2020-06-04 NOTE — ED Provider Notes (Signed)
Mary Keith    CSN: [id number] Arrival date & time: 06/04/20  0957      History   Chief Complaint Chief Complaint  Patient presents with  . Sore Throat  . Otalgia    HPI Mary Keith is a 44 y.o. female.   Patient presents with a sore throat x8 days.  She also reports 4-day history of low grade fever, ear pain, headache, nonproductive cough, and fatigue.  She denies rash, shortness of breath, vomiting, diarrhea, arthralgias, or other symptoms.  Treatment attempted at home with Tylenol and ibuprofen.  The history is provided by the patient.    Past Medical History:  Diagnosis Date  . Depressive disorder, not elsewhere classified   . Elevated blood pressure reading without diagnosis of hypertension   . Migraine without aura, without mention of intractable migraine without mention of status migrainosus   . Pure hypercholesterolemia   . Unspecified urinary incontinence     Patient Active Problem List   Diagnosis Date Noted  . Encounter for monitoring estrogen replacement therapy following surgical menopause 07/07/2019  . Low grade fever 06/23/2019  . 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 Rosacea 10/03/2016  . Major depressive disorder, recurrent episode, moderate (HCC) 10/03/2016  . Generalized anxiety disorder 10/03/2016  . Lumbar pain with radiation down right leg 05/13/2016  . Acute sinusitis 11/09/2015  . 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 HYPERCHOLESTEROLEMIA 08/09/2010  . Migraine without aura 08/09/2010  . Asthma, mild intermittent 08/09/2010  . GASTROESOPHAGEAL REFLUX DISEASE 08/09/2010  . URINARY INCONTINENCE 08/09/2010  . ELEVATED BP READING WITHOUT DX HYPERTENSION 08/09/2010    Past Surgical History:  Procedure Laterality Date  . ABDOMINAL HYSTERECTOMY    . OOPHORECTOMY    . TONSILLECTOMY AND ADENOIDECTOMY      OB History   No obstetric history on file.      Home Medications    Prior to Admission  medications   Medication Sig Start Date End Date Taking? Authorizing Provider  omeprazole (PRILOSEC) 20 MG capsule Take 20 mg by mouth daily.   Yes [provider]  venlafaxine XR (EFFEXOR-XR) 37.5 MG 24 hr capsule TAKE 1 CAPSULE BY MOUTH ONCE DAILY 04/10/20  Yes Bedsole, Amy E, MD  albuterol (PROVENTIL HFA;VENTOLIN HFA) 108 (90 Base) MCG/ACT inhaler Inhale 2 puffs into the lungs every 6 (six) hours as needed for wheezing or shortness of breath. 07/30/18   Bedsole, Amy E, MD  diclofenac (VOLTAREN) 75 MG EC tablet Take 1 tablet (75 mg total) by mouth 2 (two) times daily. 10/05/19   Hyatt, Max T, DPM  eletriptan (RELPAX) 40 MG tablet TAKE ONE TABLET AS NEEDED FOR MIGRAINE OR HEADACHE. MAY REPEAT IN 2 HOURS IF HEADACHE PERSISTS OR RECURS 03/13/20   Bedsole, Amy E, MD  estradiol (DOTTI) 0.025 MG/24HR Place 1 patch onto the skin 2 (two) times a week. 12/22/19   Bedsole, Amy E, MD  meloxicam (MOBIC) 15 MG tablet Take 1 tablet (15 mg total) by mouth daily. 08/31/19   Hyatt, Max T, DPM  methylPREDNISolone (MEDROL DOSEPAK) 4 MG TBPK tablet 6 day dose pack - take as directed 08/31/19   Hyatt, Max T, DPM  metroNIDAZOLE (METROGEL) 1 % gel  12/07/18   [provider]  mometasone (NASONEX) 50 MCG/ACT nasal spray USE 2 SPRAYS IN EACH NOSTRIL ONCE DAILY AS DIRECTED 12/19/19   Bedsole, Amy E, MD  promethazine (PHENERGAN) 25 MG tablet Take 1 tablet (25 mg total) by mouth  every 8 (eight) hours as needed for nausea or vomiting. 09/14/19   Bedsole, Amy E, MD  SUMAtriptan (IMITREX) 50 MG tablet TAKE ONE TABLET EVERY 2 HOURS AS NEEDED FOR MIGRAINE 07/15/19   Excell Seltzer, MD    Family History Family History  Problem Relation Age of Onset  . Aneurysm Father   . Depression Mother   . Lupus Mother   . Sjogren's syndrome Mother   . Breast cancer Maternal Aunt     Social History Social History   Tobacco Use  . Smoking status: Never Smoker  . Smokeless tobacco: Never Used  Substance Use Topics  .  Alcohol use: No  . Drug use: No     Allergies   Eggs or egg-derived products, Milk-related compounds, and Peanut-containing drug products   Review of Systems Review of Systems  Constitutional: Positive for fatigue. Negative for chills and fever.  HENT: Positive for ear pain and sore throat. Negative for congestion.   Eyes: Negative for pain and visual disturbance.  Respiratory: Positive for cough. Negative for shortness of breath.   Cardiovascular: Negative for chest pain and palpitations.  Gastrointestinal: Negative for abdominal pain, diarrhea, nausea and vomiting.  Genitourinary: Negative for dysuria and hematuria.  Musculoskeletal: Negative for arthralgias and back pain.  Skin: Negative for color change and rash.  Neurological: Negative for seizures and syncope.  All other systems reviewed and are negative.    Physical Exam Triage Vital Signs ED Triage Vitals  Enc Vitals Group     BP 06/04/20 1000 (!) 127/95     Pulse Rate 06/04/20 1000 102     Resp 06/04/20 1000 16     Temp 06/04/20 1000 99.1 F (37.3 C)     Temp Source 06/04/20 1000 Oral     SpO2 06/04/20 1000 98 %     Weight --      Height --      Head Circumference --      Peak Flow --      Pain Score 06/04/20 1003 6     Pain Loc --      Pain Edu? --      Excl. in GC? --    No data found.  Updated Vital Signs BP (!) 127/95 (BP Location: Right Arm)   Pulse 102   Temp 99.1 F (37.3 C) (Oral)   Resp 16   SpO2 98%   Visual Acuity Right Eye Distance:   Left Eye Distance:   Bilateral Distance:    Right Eye Near:   Left Eye Near:    Bilateral Near:     Physical Exam Vitals and nursing note reviewed.  Constitutional:      General: She is not in acute distress.    Appearance: She is well-developed.  HENT:     Head: Normocephalic and atraumatic.     Right Ear: Tympanic membrane normal.     Left Ear: Tympanic membrane normal.     Nose: Nose normal.     Mouth/Throat:     Mouth: Mucous membranes  are moist.     Pharynx: Posterior oropharyngeal erythema present. No oropharyngeal exudate.     Tonsils: 0 on the right. 0 on the left.  Eyes:     Conjunctiva/sclera: Conjunctivae normal.  Cardiovascular:     Rate and Rhythm: Normal rate and regular rhythm.     Heart sounds: No murmur heard.   Pulmonary:     Effort: Pulmonary effort is normal. No respiratory distress.  Breath sounds: Normal breath sounds. No wheezing or rhonchi.  Abdominal:     Palpations: Abdomen is soft.     Tenderness: There is no abdominal tenderness. There is no guarding or rebound.  Musculoskeletal:     Cervical back: Neck supple.  Skin:    General: Skin is warm and dry.     Findings: No rash.  Neurological:     General: No focal deficit present.     Mental Status: She is alert and oriented to person, place, and time.     Gait: Gait normal.  Psychiatric:        Mood and Affect: Mood normal.        Behavior: Behavior normal.      UC Treatments / Results  Labs (all labs ordered are listed, but only abnormal results are displayed) Labs Reviewed  POCT RAPID STREP A (OFFICE) - Abnormal; Notable for the following components:      Result Value   Rapid Strep A Screen Positive (*)    All other components within normal limits  NOVEL CORONAVIRUS, NAA    EKG   Radiology No results found.  Procedures Procedures (including critical care time)  Medications Ordered in UC Medications  penicillin g benzathine (BICILLIN LA) 1200000 UNIT/2ML injection 1.2 Million Units (has no administration in time range)    Initial Impression / Assessment and Plan / UC Course  I have reviewed the triage vital signs and the nursing notes.  Pertinent labs & imaging results that were available during my care of the patient were reviewed by me and considered in my medical decision making (see chart for details).   Strep throat.  Treated with Bicillin LA.  Instructed patient to take Tylenol or ibuprofen as needed for  fever or discomfort.  PCR COVID pending.  Instructed her to self quarantine until the test result is back.  Instructed patient to follow-up with her PCP if her symptoms are not improving.  Patient agrees to plan of care.     Final Clinical Impressions(s) / UC Diagnoses   Final diagnoses:  Streptococcal sore throat     Discharge Instructions     You were given an injection of penicillin today.  No additional antibiotic is required at this time.    Take Tylenol or ibuprofen as needed for fever or discomfort.    Your COVID test is pending.  You should self quarantine until the test result is back.    Follow up with your primary care provider if your symptoms are not improving.            ED Prescriptions    None     PDMP not reviewed this encounter.   Mickie Bail, NP 06/04/20 1035

## 2020-06-04 NOTE — ED Triage Notes (Signed)
Pt presents to UC for sore throat, ear pain, headache, and fatigue x4 days. Pt has been treating with tylenol/ibuprofen, nyquill/dayquill. Pt endorsing low grade fever at home. Pt had negative covid Friday.

## 2020-06-05 LAB — SARS-COV-2, NAA 2 DAY TAT

## 2020-06-05 LAB — NOVEL CORONAVIRUS, NAA: SARS-CoV-2, NAA: NOT DETECTED

## 2020-06-08 ENCOUNTER — Telehealth: Payer: Self-pay

## 2020-06-08 NOTE — Telephone Encounter (Signed)
[facility] Day - Client TELEPHONE ADVICE RECORD [facility] Patient Name: Mary Keith Gender: Female DOB: 05-15-1976 Age: 44 Y 2 M 20 D Return Phone Number: [phone] (Primary) Address[place] [address] Client [facility] Day - Client Client Site [facility] - Day Physician Kerby Nora - MD Contact Type Call Who Is Calling Patient / Member / Family / Caregiver Call Type Triage / Clinical Relationship To Patient Self Return Phone Number [phone] (Primary) Chief Complaint Sore Throat Reason for Call Symptomatic / Request for Health Information Initial Comment Caller says that she has been sick for 2 weeks. She is negative for COVID. On Monday she tested positive for strep and got a penicillin shot. Her throat pain is not as bad but has to make effort to speak and it hurts to talk. Her left ear has been hurting and is still plugged up. Translation No Nurse Assessment Nurse: Alexander Mt, RN, Nicholaus Bloom Date/Time (Eastern Time): 06/08/2020 10:42:24 AM Confirm and document reason for call. If symptomatic, describe symptoms. ---Caller says that she has been sick for 2 weeks. She is negative for COVID. On Monday she tested positive for strep and got a penicillin shot. Her throat pain is not as bad but has to make effort to speak and it hurts to talk. Her left ear has been hurting and is still plugged up. She is eating and drinking good. Dry cough. Has the patient had close contact with a person known or suspected to have the novel coronavirus illness OR traveled / lives in area with major community spread (including international travel) in the last 14 days from the onset of symptoms? * If Asymptomatic, screen for exposure and travel within the last 14 days. ---No Does the patient have any new or worsening symptoms? ---Yes Will a triage be completed? ---Yes Related visit to physician within the  last 2 weeks? ---Yes Does the PT have any chronic conditions? (i.e. diabetes, asthma, this includes High risk factors for pregnancy, etc.) ---Yes List chronic conditions. ---asthma Is the patient pregnant or possibly pregnant? (Ask all females between the ages of 75-55) ---No Is this a behavioral health or substance abuse call? ---No PLEASE NOTE: All timestamps contained within this report are represented as [place] Standard Time. CONFIDENTIALTY NOTICE: This fax transmission is intended only for the addressee. It contains information that is legally privileged, confidential or otherwise protected from use or disclosure. If you are not the intended recipient, you are strictly prohibited from reviewing, disclosing, copying using or disseminating any of this information or taking any action in reliance on or regarding this information. If you have received this fax in error, please notify us immediately by telephone so that we can arrange for its return to [place]. Phone: [phone], Toll-Free: [phone], Fax: [phone] Page: 2 of 2 Call Id: [id number] Guidelines Guideline Title Affirmed Question Affirmed Notes Nurse Date/Time Lamount Cohen Time) Strep Throat Infection on Antibiotic Follow-up Call [1] Reasonable improvement on antibiotics AND [2] no fever Graylin Shiver 06/08/2020 10:44:36 AM Disp. Time Lamount Cohen Time) Disposition Final User 06/08/2020 10:46:56 AM Home Care Yes Alexander Mt, RN, Prentiss Bells Disagree/Comply Comply Caller Understands Yes PreDisposition Did not know what to do Care Advice Given Per Guideline HOME CARE: * You should be able to treat this at home. REASSURANCE AND EDUCATION: * Most bacterial infections do not respond to the first dose of an antibiotic. * Often there is not improvement the first day. * You gradually get better  over 2 to 3 days. SORE THROAT: * Here are some simple things you can do to treat and reduce sore throat pain. * Sip warm chicken broth or  apple juice. * Suck on hard candy or an over-the-counter throat lozenge. SOFT DIET: * Eat a soft diet. * Cold drinks, popsicles, and milk shakes are especially good. Avoid citrus fruits. STREPTOCOCCAL PHARYNGITIS (STREP THROAT): * Cause: Streptococcus bacteria * Symptoms: Sore throat and painful swallowing are the main symptoms. Other findings include fever, redness of the throat, pus on the tonsils, swollen tonsils, and swollen lymph nodes in the neck. CARE ADVICE given per Strep Throat Infection on Antibiotic Follow-Up Call (Adult) guideline. CALL BACK IF: * Symptoms last over 3 days on antibiotics * You become worse.

## 2020-06-08 NOTE — Telephone Encounter (Signed)
Pt seen UC on 06/04/20.

## 2020-06-18 ENCOUNTER — Other Ambulatory Visit: Payer: Self-pay | Admitting: Family Medicine

## 2020-07-06 ENCOUNTER — Ambulatory Visit: Payer: Managed Care, Other (non HMO) | Admitting: Family Medicine

## 2020-07-06 ENCOUNTER — Encounter: Payer: Self-pay | Admitting: Family Medicine

## 2020-07-06 ENCOUNTER — Other Ambulatory Visit: Payer: Self-pay

## 2020-07-06 VITALS — BP 120/88 | HR 84 | Temp 97.7°F | Ht 65.51 in | Wt 234.0 lb

## 2020-07-06 DIAGNOSIS — G43109 Migraine with aura, not intractable, without status migrainosus: Secondary | ICD-10-CM | POA: Diagnosis not present

## 2020-07-06 DIAGNOSIS — M222X1 Patellofemoral disorders, right knee: Secondary | ICD-10-CM | POA: Diagnosis not present

## 2020-07-06 DIAGNOSIS — F331 Major depressive disorder, recurrent, moderate: Secondary | ICD-10-CM | POA: Diagnosis not present

## 2020-07-06 DIAGNOSIS — M222X2 Patellofemoral disorders, left knee: Secondary | ICD-10-CM | POA: Diagnosis not present

## 2020-07-06 MED ORDER — VENLAFAXINE HCL ER 75 MG PO CP24: 75.0000 mg | ORAL_CAPSULE | Freq: Every day | ORAL | 5 refills | Status: AC

## 2020-07-06 NOTE — Assessment & Plan Note (Signed)
Discussed home PT, glucosamine and cho-pat strap.

## 2020-07-06 NOTE — Assessment & Plan Note (Addendum)
Worsened control with increased stress and mood worsening.  Will start with increase of effexor but she will likely need additional prophylactic. Given issues with sleep.Marland Kitchen elavil or topamax may be next best option.  Normal neuro exam.. no indication for repeat MRI unless not improving as expected.   She did see neuro years ago.

## 2020-07-06 NOTE — Progress Notes (Signed)
Chief Complaint  Patient presents with  . Migraine    Pt c/o migraine. States they have been present since beginning the Relpax  . Knee Pain    Pt would like to discuss knee discomfort when squatting or going up stairs    History of Present Illness: HPI   44 year old female presents with chronic migraine worsening in last  12 months. Occuring in waves.. sometimes 1-2 times a week.  Usually more than 3 .  Throbbing in temple and in shoulder on right, photophonophobia, nausea associated.  Associated with dizziness and sometimes left ear pain.  Lasts 1 day.. if takes Relpax it goes away. Sometimes seems to have a rebound headache a day later.  Triggers: stress given pandemic, she does feel like effexor is controlling her GAD. Now some mild depressive symptoms. Mother passed from suicide 09/2019  She also reports new onset pain in right knee.. notes when going up stairs or squatting.   Father with aneurysm  mother with benign brain tumor.  Normal MRI in 1999   This visit occurred during the SARS-CoV-2 public health emergency.  Safety protocols were in place, including screening questions prior to the visit, additional usage of staff PPE, and extensive cleaning of exam room while observing appropriate contact time as indicated for disinfecting solutions.   COVID 19 screen:  No recent travel or known exposure to COVID19 The patient denies respiratory symptoms of COVID 19 at this time. The importance of social distancing was discussed today.     Review of Systems  Constitutional: Negative for chills and fever.  HENT: Negative for congestion and ear pain.   Eyes: Negative for pain and redness.  Respiratory: Negative for cough and shortness of breath.   Cardiovascular: Negative for chest pain, palpitations and leg swelling.  Gastrointestinal: Negative for abdominal pain, blood in stool, constipation, diarrhea, nausea and vomiting.  Genitourinary: Negative for dysuria.   Musculoskeletal: Negative for falls and myalgias.  Skin: Negative for rash.  Neurological: Positive for headaches. Negative for dizziness.  Psychiatric/Behavioral: Negative for depression. The patient is not nervous/anxious.       Past Medical History:  Diagnosis Date  . Depressive disorder, not elsewhere classified   . Elevated blood pressure reading without diagnosis of hypertension   . Migraine without aura, without mention of intractable migraine without mention of status migrainosus   . Pure hypercholesterolemia   . Unspecified urinary incontinence     reports that she has never smoked. She has never used smokeless tobacco. She reports that she does not drink alcohol and does not use drugs.   Current Outpatient Medications:  .  albuterol (PROVENTIL HFA;VENTOLIN HFA) 108 (90 Base) MCG/ACT inhaler, Inhale 2 puffs into the lungs every 6 (six) hours as needed for wheezing or shortness of breath., Disp: 1 Inhaler, Rfl: 2 .  eletriptan (RELPAX) 40 MG tablet, TAKE ONE TABLET AS NEEDED FOR MIGRAINE OR HEADACHE. MAY REPEAT IN 2 HOURS IF HEADACHE PERSISTS OR RECURS, Disp: 10 tablet, Rfl: 0 .  metroNIDAZOLE (METROGEL) 1 % gel, , Disp: , Rfl:  .  mometasone (NASONEX) 50 MCG/ACT nasal spray, USE 2 SPRAYS IN EACH NOSTRIL ONCE DAILY AS DIRECTED, Disp: 17 g, Rfl: 5 .  omeprazole (PRILOSEC) 20 MG capsule, Take 20 mg by mouth daily., Disp: , Rfl:  .  SUMAtriptan (IMITREX) 50 MG tablet, TAKE ONE TABLET EVERY 2 HOURS AS NEEDED FOR MIGRAINE, Disp: 9 tablet, Rfl: 2 .  venlafaxine XR (EFFEXOR-XR) 37.5 MG 24 hr capsule, TAKE  1 CAPSULE BY MOUTH ONCE DAILY, Disp: 90 capsule, Rfl: 0 .  diclofenac (VOLTAREN) 75 MG EC tablet, Take 1 tablet (75 mg total) by mouth 2 (two) times daily. (Patient not taking: Reported on 07/06/2020), Disp: 60 tablet, Rfl: 3 .  estradiol (DOTTI) 0.025 MG/24HR, Place 1 patch onto the skin 2 (two) times a week. (Patient not taking: Reported on 07/06/2020), Disp: 8 patch, Rfl: 12 .  meloxicam  (MOBIC) 15 MG tablet, Take 1 tablet (15 mg total) by mouth daily. (Patient not taking: Reported on 07/06/2020), Disp: 30 tablet, Rfl: 3 .  methylPREDNISolone (MEDROL DOSEPAK) 4 MG TBPK tablet, 6 day dose pack - take as directed (Patient not taking: Reported on 07/06/2020), Disp: 21 tablet, Rfl: 0 .  promethazine (PHENERGAN) 25 MG tablet, Take 1 tablet (25 mg total) by mouth every 8 (eight) hours as needed for nausea or vomiting. (Patient not taking: Reported on 07/06/2020), Disp: 20 tablet, Rfl: 0   Observations/Objective: Blood pressure 120/88, pulse 84, temperature 97.7 F (36.5 C), height 5' 5.51" (1.664 m), weight 234 lb (106.1 kg), SpO2 96 %.  Physical Exam Constitutional:      General: She is not in acute distress.    Appearance: Normal appearance. She is well-developed. She is not ill-appearing or toxic-appearing.  HENT:     Head: Normocephalic.     Right Ear: Hearing, tympanic membrane, ear canal and external ear normal. Tympanic membrane is not erythematous, retracted or bulging.     Left Ear: Hearing, tympanic membrane, ear canal and external ear normal. Tympanic membrane is not erythematous, retracted or bulging.     Nose: No mucosal edema or rhinorrhea.     Right Sinus: No maxillary sinus tenderness or frontal sinus tenderness.     Left Sinus: No maxillary sinus tenderness or frontal sinus tenderness.     Mouth/Throat:     Pharynx: Uvula midline.  Eyes:     General: Lids are normal. Lids are everted, no foreign bodies appreciated.     Conjunctiva/sclera: Conjunctivae normal.     Pupils: Pupils are equal, round, and reactive to light.  Neck:     Thyroid: No thyroid mass or thyromegaly.     Vascular: No carotid bruit.     Trachea: Trachea normal.  Cardiovascular:     Rate and Rhythm: Normal rate and regular rhythm.     Pulses: Normal pulses.     Heart sounds: Normal heart sounds, S1 normal and S2 normal. No murmur heard.  No friction rub. No gallop.   Pulmonary:     Effort:  Pulmonary effort is normal. No tachypnea or respiratory distress.     Breath sounds: Normal breath sounds. No decreased breath sounds, wheezing, rhonchi or rales.  Abdominal:     General: Bowel sounds are normal.     Palpations: Abdomen is soft.     Tenderness: There is no abdominal tenderness.  Musculoskeletal:     Cervical back: Normal range of motion and neck supple.     Right knee: Crepitus present. No bony tenderness. No tenderness. Abnormal patellar mobility. Normal meniscus.     Instability Tests: Anterior drawer test negative. Posterior drawer test negative. Medial McMurray test negative.     Left knee: Crepitus present. No bony tenderness. No tenderness. Abnormal patellar mobility. Normal meniscus.     Instability Tests: Anterior drawer test negative. Posterior drawer test negative. Medial McMurray test negative.  Skin:    General: Skin is warm and dry.     Findings: No rash.  Neurological:     Mental Status: She is alert and oriented to person, place, and time.     GCS: GCS eye subscore is 4. GCS verbal subscore is 5. GCS motor subscore is 6.     Cranial Nerves: No cranial nerve deficit.     Sensory: No sensory deficit.     Motor: No abnormal muscle tone.     Coordination: Coordination normal.     Gait: Gait normal.     Deep Tendon Reflexes: Reflexes are normal and symmetric.     Comments: Nml cerebellar exam   No papilledema  Psychiatric:        Mood and Affect: Mood is not anxious or depressed.        Speech: Speech normal.        Behavior: Behavior normal. Behavior is cooperative.        Thought Content: Thought content normal.        Cognition and Memory: Memory is not impaired. She does not exhibit impaired recent memory or impaired remote memory.        Judgment: Judgment normal.      Assessment and Plan Migraine with aura Worsened control with increased stress and mood worsening.  Will start with increase of effexor but she will likely need additional  prophylactic. Given issues with sleep.Marland Kitchen elavil or topamax may be next best option.  Normal neuro exam.. no indication for repeat MRI unless not improving as expected.   She did see neuro years ago.  Patellofemoral disorder of both knees Discussed home PT, glucosamine and cho-pat strap.       Kerby Nora, MD

## 2020-07-06 NOTE — Patient Instructions (Addendum)
Start with  increase in effexor to 75 mg daily.  Increae water, get adequate sleep.  Start headache diary review triggers.   Start glucosamine 500 mg 1-3 times daily for knee pain.. patellofemoral syndrome.  Start leg exercises.

## 2020-07-16 ENCOUNTER — Telehealth: Payer: Self-pay | Admitting: Family Medicine

## 2020-07-16 DIAGNOSIS — E7800 Pure hypercholesterolemia, unspecified: Secondary | ICD-10-CM

## 2020-07-16 NOTE — Telephone Encounter (Signed)
-----   Message from Terri J Walsh sent at 07/03/2020 10:29 AM EDT ----- Regarding: Lab orders for Tuesday 9.14.21 Patient is scheduled for CPX labs, please order future labs, Thanks , Terri  

## 2020-07-17 ENCOUNTER — Other Ambulatory Visit (INDEPENDENT_AMBULATORY_CARE_PROVIDER_SITE_OTHER): Payer: Managed Care, Other (non HMO)

## 2020-07-17 ENCOUNTER — Other Ambulatory Visit: Payer: Self-pay

## 2020-07-17 DIAGNOSIS — E7800 Pure hypercholesterolemia, unspecified: Secondary | ICD-10-CM

## 2020-07-17 LAB — COMPREHENSIVE METABOLIC PANEL
ALT: 23 U/L (ref 0–35)
AST: 23 U/L (ref 0–37)
Albumin: 4.3 g/dL (ref 3.5–5.2)
Alkaline Phosphatase: 64 U/L (ref 39–117)
BUN: 18 mg/dL (ref 6–23)
CO2: 26 mEq/L (ref 19–32)
Calcium: 9.8 mg/dL (ref 8.4–10.5)
Chloride: 101 mEq/L (ref 96–112)
Creatinine, Ser: 1.08 mg/dL (ref 0.40–1.20)
GFR: 55.03 mL/min — ABNORMAL LOW (ref >60.00)
Glucose, Bld: 88 mg/dL (ref 70–99)
Potassium: 4.6 mEq/L (ref 3.5–5.1)
Sodium: 136 mEq/L (ref 135–145)
Total Bilirubin: 0.5 mg/dL (ref 0.2–1.2)
Total Protein: 7.2 g/dL (ref 6.0–8.3)

## 2020-07-17 LAB — LIPID PANEL
Cholesterol: 176 mg/dL (ref 0–200)
HDL: 58.3 mg/dL (ref >39.00)
LDL Cholesterol: 98 mg/dL (ref 0–99)
NonHDL: 117.98
Total CHOL/HDL Ratio: 3
Triglycerides: 102 mg/dL (ref 0.0–149.0)
VLDL: 20.4 mg/dL (ref 0.0–40.0)

## 2020-07-17 NOTE — Progress Notes (Signed)
No critical labs need to be addressed urgently. We will discuss labs in detail at upcoming office visit.   

## 2020-07-20 ENCOUNTER — Other Ambulatory Visit: Payer: Self-pay

## 2020-07-20 ENCOUNTER — Ambulatory Visit (INDEPENDENT_AMBULATORY_CARE_PROVIDER_SITE_OTHER): Payer: Managed Care, Other (non HMO) | Admitting: Family Medicine

## 2020-07-20 VITALS — BP 130/92 | HR 86 | Temp 97.6°F | Ht 66.5 in | Wt 232.8 lb

## 2020-07-20 DIAGNOSIS — R030 Elevated blood-pressure reading, without diagnosis of hypertension: Secondary | ICD-10-CM

## 2020-07-20 DIAGNOSIS — F331 Major depressive disorder, recurrent, moderate: Secondary | ICD-10-CM

## 2020-07-20 DIAGNOSIS — Z0000 Encounter for general adult medical examination without abnormal findings: Secondary | ICD-10-CM

## 2020-07-20 DIAGNOSIS — G43109 Migraine with aura, not intractable, without status migrainosus: Secondary | ICD-10-CM

## 2020-07-20 DIAGNOSIS — E7800 Pure hypercholesterolemia, unspecified: Secondary | ICD-10-CM | POA: Diagnosis not present

## 2020-07-20 DIAGNOSIS — J4520 Mild intermittent asthma, uncomplicated: Secondary | ICD-10-CM

## 2020-07-20 DIAGNOSIS — Z23 Encounter for immunization: Secondary | ICD-10-CM | POA: Diagnosis not present

## 2020-07-20 NOTE — Assessment & Plan Note (Signed)
Slight elevation x 2 .. follow BPs at home. If migraines increasing, treating BP may help decrease migraine frequency.

## 2020-07-20 NOTE — Assessment & Plan Note (Signed)
Decreased frequency with improvement in mood and on higher dose of venlafaxine.

## 2020-07-20 NOTE — Patient Instructions (Addendum)
Continue the higher dose of venlafaxine.  Get BP cuff.. follow BPs at home.Marland Kitchen goal < 140/90.  Work on  Exercise ( 3-5 days a week), weight loss, healthy eating habits.  Avoid  Ibuprofen and other NSAIDs given decrease in kidney function.   Preventive Care 30-44 Years Old, Female Preventive care refers to visits with your health care provider and lifestyle choices that can promote health and wellness. This includes:  A yearly physical exam. This may also be called an annual well check.  Regular dental visits and eye exams.  Immunizations.  Screening for certain conditions.  Healthy lifestyle choices, such as eating a healthy diet, getting regular exercise, not using drugs or products that contain nicotine and tobacco, and limiting alcohol use. What can I expect for my preventive care visit? Physical exam Your health care provider will check your:  Height and weight. This may be used to calculate body mass index (BMI), which tells if you are at a healthy weight.  Heart rate and blood pressure.  Skin for abnormal spots. Counseling Your health care provider may ask you questions about your:  Alcohol, tobacco, and drug use.  Emotional well-being.  Home and relationship well-being.  Sexual activity.  Eating habits.  Work and work Statistician.  Method of birth control.  Menstrual cycle.  Pregnancy history. What immunizations do I need?  Influenza (flu) vaccine  This is recommended every year. Tetanus, diphtheria, and pertussis (Tdap) vaccine  You may need a Td booster every 10 years. Varicella (chickenpox) vaccine  You may need this if you have not been vaccinated. Human papillomavirus (HPV) vaccine  If recommended by your health care provider, you may need three doses over 6 months. Measles, mumps, and rubella (MMR) vaccine  You may need at least one dose of MMR. You may also need a second dose. Meningococcal conjugate (MenACWY) vaccine  One dose is  recommended if you are age 48-21 years and a first-year college student living in a residence hall, or if you have one of several medical conditions. You may also need additional booster doses. Pneumococcal conjugate (PCV13) vaccine  You may need this if you have certain conditions and were not previously vaccinated. Pneumococcal polysaccharide (PPSV23) vaccine  You may need one or two doses if you smoke cigarettes or if you have certain conditions. Hepatitis A vaccine  You may need this if you have certain conditions or if you travel or work in places where you may be exposed to hepatitis A. Hepatitis B vaccine  You may need this if you have certain conditions or if you travel or work in places where you may be exposed to hepatitis B. Haemophilus influenzae type b (Hib) vaccine  You may need this if you have certain conditions. You may receive vaccines as individual doses or as more than one vaccine together in one shot (combination vaccines). Talk with your health care provider about the risks and benefits of combination vaccines. What tests do I need?  Blood tests  Lipid and cholesterol levels. These may be checked every 5 years starting at age 48.  Hepatitis C test.  Hepatitis B test. Screening  Diabetes screening. This is done by checking your blood sugar (glucose) after you have not eaten for a while (fasting).  Sexually transmitted disease (STD) testing.  BRCA-related cancer screening. This may be done if you have a family history of breast, ovarian, tubal, or peritoneal cancers.  Pelvic exam and Pap test. This may be done every 3 years starting  at age 5. Starting at age 57, this may be done every 5 years if you have a Pap test in combination with an HPV test. Talk with your health care provider about your test results, treatment options, and if necessary, the need for more tests. Follow these instructions at home: Eating and drinking   Eat a diet that includes fresh  fruits and vegetables, whole grains, lean protein, and low-fat dairy.  Take vitamin and mineral supplements as recommended by your health care provider.  Do not drink alcohol if: ? Your health care provider tells you not to drink. ? You are pregnant, may be pregnant, or are planning to become pregnant.  If you drink alcohol: ? Limit how much you have to 0-1 drink a day. ? Be aware of how much alcohol is in your drink. In the [place] one drink equals one 12 oz bottle of beer (355 mL), one 5 oz glass of wine (148 mL), or one 1 oz glass of hard liquor (44 mL). Lifestyle  Take daily care of your teeth and gums.  Stay active. Exercise for at least 30 minutes on 5 or more days each week.  Do not use any products that contain nicotine or tobacco, such as cigarettes, e-cigarettes, and chewing tobacco. If you need help quitting, ask your health care provider.  If you are sexually active, practice safe sex. Use a condom or other form of birth control (contraception) in order to prevent pregnancy and STIs (sexually transmitted infections). If you plan to become pregnant, see your health care provider for a preconception visit. What's next?  Visit your health care provider once a year for a well check visit.  Ask your health care provider how often you should have your eyes and teeth checked.  Stay up to date on all vaccines. This information is not intended to replace advice given to you by your health care provider. Make sure you discuss any questions you have with your health care provider. Document Revised: 07/01/2018 Document Reviewed: 07/01/2018 Elsevier Patient Education  2020 [employer].

## 2020-07-20 NOTE — Progress Notes (Signed)
Chief Complaint  Patient presents with   Annual Exam    History of Present Illness: HPI  The patient is here for annual wellness exam and preventative care.    Elevated BP in office today  BP Readings from Last 3 Encounters:  07/20/20 (!) 130/92  07/06/20 120/88  06/04/20 (!) 127/95    Asthma mild intermittent: no recent exacerbations.  Elevated Cholesterol:  Lab Results  Component Value Date   CHOL 176 07/17/2020   HDL 58.30 07/17/2020   LDLCALC 98 07/17/2020   TRIG 102.0 07/17/2020   CHOLHDL 3 07/17/2020  Using medications without problems: Muscle aches:  Diet compliance: moderate.. was worse Exercise:minimal Other complaints: Wt Readings from Last 3 Encounters:  07/20/20 232 lb 12 oz (105.6 kg)  07/06/20 234 lb (106.1 kg)  07/07/19 232 lb (105.2 kg)     MDD, VEH:MCNOBS increase in venlafaxine dose to 75 mg daily at last OV.  She has noted improvement in mood, able to tolerate stressful situations better.   Migraine: Now on higher dose of venlafaxine migraines occurring  Much less frequently.  She is sleeping better.    This visit occurred during the SARS-CoV-2 public health emergency.  Safety protocols were in place, including screening questions prior to the visit, additional usage of staff PPE, and extensive cleaning of exam room while observing appropriate contact time as indicated for disinfecting solutions.   COVID 19 screen:  No recent travel or known exposure to COVID19 The patient denies respiratory symptoms of COVID 19 at this time. The importance of social distancing was discussed today.     Review of Systems  Constitutional: Negative for chills and fever.  HENT: Negative for congestion and ear pain.   Eyes: Negative for pain and redness.  Respiratory: Negative for cough and shortness of breath.   Cardiovascular: Negative for chest pain, palpitations and leg swelling.  Gastrointestinal: Negative for abdominal pain, blood in stool,  constipation, diarrhea, nausea and vomiting.  Genitourinary: Negative for dysuria.  Musculoskeletal: Negative for falls and myalgias.  Skin: Negative for rash.  Neurological: Negative for dizziness.  Psychiatric/Behavioral: Negative for depression. The patient is not nervous/anxious.       Past Medical History:  Diagnosis Date   Depressive disorder, not elsewhere classified    Elevated blood pressure reading without diagnosis of hypertension    Migraine without aura, without mention of intractable migraine without mention of status migrainosus    Pure hypercholesterolemia    Unspecified urinary incontinence     reports that she has never smoked. She has never used smokeless tobacco. She reports that she does not drink alcohol and does not use drugs.   Current Outpatient Medications:    eletriptan (RELPAX) 40 MG tablet, TAKE ONE TABLET AS NEEDED FOR MIGRAINE OR HEADACHE. MAY REPEAT IN 2 HOURS IF HEADACHE PERSISTS OR RECURS, Disp: 10 tablet, Rfl: 0   metroNIDAZOLE (METROGEL) 1 % gel, Apply 1 application topically daily. , Disp: , Rfl:    mometasone (NASONEX) 50 MCG/ACT nasal spray, USE 2 SPRAYS IN EACH NOSTRIL ONCE DAILY AS DIRECTED, Disp: 17 g, Rfl: 5   omeprazole (PRILOSEC) 20 MG capsule, Take 20 mg by mouth daily., Disp: , Rfl:    SUMAtriptan (IMITREX) 50 MG tablet, TAKE ONE TABLET EVERY 2 HOURS AS NEEDED FOR MIGRAINE, Disp: 9 tablet, Rfl: 2   venlafaxine XR (EFFEXOR XR) 75 MG 24 hr capsule, Take 1 capsule (75 mg total) by mouth daily with breakfast., Disp: 30 capsule, Rfl: 5   Observations/Objective:  Blood pressure (!) 130/92, pulse 86, temperature 97.6 F (36.4 C), temperature source Temporal, height 5' 6.5" (1.689 m), weight 232 lb 12 oz (105.6 kg), SpO2 98 %.  Physical Exam Constitutional:      General: She is not in acute distress.    Appearance: Normal appearance. She is well-developed. She is obese. She is not ill-appearing or toxic-appearing.  HENT:     Head:  Normocephalic.     Right Ear: Hearing, tympanic membrane, ear canal and external ear normal.     Left Ear: Hearing, tympanic membrane, ear canal and external ear normal.     Nose: Nose normal.  Eyes:     General: Lids are normal. Lids are everted, no foreign bodies appreciated.     Conjunctiva/sclera: Conjunctivae normal.     Pupils: Pupils are equal, round, and reactive to light.  Neck:     Thyroid: No thyroid mass or thyromegaly.     Vascular: No carotid bruit.     Trachea: Trachea normal.  Cardiovascular:     Rate and Rhythm: Normal rate and regular rhythm.     Heart sounds: Normal heart sounds, S1 normal and S2 normal. No murmur heard.  No gallop.   Pulmonary:     Effort: Pulmonary effort is normal. No respiratory distress.     Breath sounds: Normal breath sounds. No wheezing, rhonchi or rales.  Abdominal:     General: Bowel sounds are normal. There is no distension or abdominal bruit.     Palpations: Abdomen is soft. There is no fluid wave or mass.     Tenderness: There is no abdominal tenderness. There is no guarding or rebound.     Hernia: No hernia is present.  Musculoskeletal:     Cervical back: Normal range of motion and neck supple.  Lymphadenopathy:     Cervical: No cervical adenopathy.  Skin:    General: Skin is warm and dry.     Findings: No rash.  Neurological:     Mental Status: She is alert.     Cranial Nerves: No cranial nerve deficit.     Sensory: No sensory deficit.  Psychiatric:        Mood and Affect: Mood is not anxious or depressed.        Speech: Speech normal.        Behavior: Behavior normal. Behavior is cooperative.        Judgment: Judgment normal.      Assessment and Plan   The patient's preventative maintenance and recommended screening tests for an annual wellness exam were reviewed in full today. Brought up to date unless services declined.  Counselled on the importance of diet, exercise, and its role in overall health and  mortality. The patient's FH and SH was reviewed, including their home life, tobacco status, and drug and alcohol status.   Mammogram: She has dense breasts... nml mammo 05/2020 PAP/ DVE: TAH, no vaginal symptoms. No early family history colon cancer except PGF late 46s. Vaccines: Tdap uptodate, due for flu.Marland Kitchengiven in office today Nonsmoker,no ETOH. HIV, hep C: refused  Migraine with aura and without status migrainosus, not intractable Decreased frequency with improvement in mood and on higher dose of venlafaxine.  Mild intermittent asthma without complication Stable control.  HYPERCHOLESTEROLEMIA LDL at goal.  ELEVATED BP READING WITHOUT DX HYPERTENSION  Slight elevation x 2 .. follow BPs at home. If migraines increasing, treating BP may help decrease migraine frequency.     Kerby Nora, MD

## 2020-07-20 NOTE — Assessment & Plan Note (Signed)
Stable control. 

## 2020-07-20 NOTE — Assessment & Plan Note (Signed)
LDL at goal 

## 2020-07-27 ENCOUNTER — Emergency Department (HOSPITAL_COMMUNITY): Payer: Managed Care, Other (non HMO)

## 2020-07-27 ENCOUNTER — Telehealth: Payer: Self-pay

## 2020-07-27 ENCOUNTER — Emergency Department (HOSPITAL_COMMUNITY)
Admission: EM | Admit: 2020-07-27 | Discharge: 2020-07-27 | Disposition: A | Discharge status: Home / Self Care | Payer: Managed Care, Other (non HMO) | Attending: Emergency Medicine | Admitting: Emergency Medicine

## 2020-07-27 ENCOUNTER — Ambulatory Visit
Admission: EM | Admit: 2020-07-27 | Discharge: 2020-07-27 | Disposition: A | Discharge status: Home / Self Care | Payer: Managed Care, Other (non HMO)

## 2020-07-27 ENCOUNTER — Other Ambulatory Visit: Payer: Self-pay

## 2020-07-27 ENCOUNTER — Encounter (HOSPITAL_COMMUNITY): Payer: Self-pay

## 2020-07-27 DIAGNOSIS — R1011 Right upper quadrant pain: Secondary | ICD-10-CM

## 2020-07-27 DIAGNOSIS — K2970 Gastritis, unspecified, without bleeding: Secondary | ICD-10-CM | POA: Insufficient documentation

## 2020-07-27 DIAGNOSIS — J4520 Mild intermittent asthma, uncomplicated: Secondary | ICD-10-CM | POA: Diagnosis not present

## 2020-07-27 DIAGNOSIS — Z7951 Long term (current) use of inhaled steroids: Secondary | ICD-10-CM | POA: Insufficient documentation

## 2020-07-27 DIAGNOSIS — Z91010 Allergy to peanuts: Secondary | ICD-10-CM | POA: Insufficient documentation

## 2020-07-27 DIAGNOSIS — K2900 Acute gastritis without bleeding: Secondary | ICD-10-CM

## 2020-07-27 LAB — COMPREHENSIVE METABOLIC PANEL
ALT: 29 U/L (ref 0–44)
AST: 25 U/L (ref 15–41)
Albumin: 4.4 g/dL (ref 3.5–5.0)
Alkaline Phosphatase: 71 U/L (ref 38–126)
Anion gap: 10 (ref 5–15)
BUN: 10 mg/dL (ref 6–20)
CO2: 28 mmol/L (ref 22–32)
Calcium: 9.7 mg/dL (ref 8.9–10.3)
Chloride: 103 mmol/L (ref 98–111)
Creatinine, Ser: 1.02 mg/dL — ABNORMAL HIGH (ref 0.44–1.00)
GFR calc Af Amer: >60 mL/min (ref >60)
GFR calc non Af Amer: >60 mL/min (ref >60)
Glucose, Bld: 98 mg/dL (ref 70–99)
Potassium: 4.7 mmol/L (ref 3.5–5.1)
Sodium: 141 mmol/L (ref 135–145)
Total Bilirubin: 0.4 mg/dL (ref 0.3–1.2)
Total Protein: 7.9 g/dL (ref 6.5–8.1)

## 2020-07-27 LAB — URINALYSIS, ROUTINE W REFLEX MICROSCOPIC
Bilirubin Urine: NEGATIVE
Glucose, UA: NEGATIVE mg/dL
Hgb urine dipstick: NEGATIVE
Ketones, ur: NEGATIVE mg/dL
Leukocytes,Ua: NEGATIVE
Nitrite: NEGATIVE
Protein, ur: NEGATIVE mg/dL
Specific Gravity, Urine: 1.006 (ref 1.005–1.030)
pH: 7 (ref 5.0–8.0)

## 2020-07-27 LAB — CBC
HCT: 45.6 % (ref 36.0–46.0)
Hemoglobin: 15.1 g/dL — ABNORMAL HIGH (ref 12.0–15.0)
MCH: 30.8 pg (ref 26.0–34.0)
MCHC: 33.1 g/dL (ref 30.0–36.0)
MCV: 92.9 fL (ref 80.0–100.0)
Platelets: 321 10*3/uL (ref 150–400)
RBC: 4.91 MIL/uL (ref 3.87–5.11)
RDW: 12.2 % (ref 11.5–15.5)
WBC: 7.6 10*3/uL (ref 4.0–10.5)
nRBC: 0 % (ref 0.0–0.2)

## 2020-07-27 LAB — LIPASE, BLOOD: Lipase: 32 U/L (ref 11–51)

## 2020-07-27 MED ORDER — FAMOTIDINE 20 MG PO TABS
20.0000 mg | ORAL_TABLET | Freq: Two times a day (BID) | ORAL | 0 refills | Status: AC
Start: 2020-07-27 — End: ?

## 2020-07-27 NOTE — Discharge Instructions (Signed)
Take the medications as needed to help with your discomfort. Your ultrasound did not show any evidence of gallstones or inflammation of your gallbladder. Follow-up with your primary care provider. Return to the ER if you start to experience worsening pain, chest pain, shortness of breath or fever.

## 2020-07-27 NOTE — Discharge Instructions (Signed)
Go to the emergency department if you have acute abdominal pain or other concerning symptoms.     

## 2020-07-27 NOTE — ED Provider Notes (Signed)
Mary Keith    CSN: [id number] Arrival date & time: 07/27/20  5643      History   Chief Complaint Chief Complaint  Patient presents with  . Abdominal Pain    RUQ    HPI Mary Keith is a 44 y.o. female.   Patient presents with right upper abdominal pain since last night.  She states it has "eased off" this morning; current pain level 3/10.  At its worst, pain was 9/10.  She reports associated nausea but no emesis, diarrhea, or constipation.  She denies fever, chills, or other symptoms.  No falls or injury.  The history is provided by the patient.    Past Medical History:  Diagnosis Date  . Depressive disorder, not elsewhere classified   . Elevated blood pressure reading without diagnosis of hypertension   . Migraine without aura, without mention of intractable migraine without mention of status migrainosus   . Pure hypercholesterolemia   . Unspecified urinary incontinence     Patient Active Problem List   Diagnosis Date Noted  . 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 Encounter for monitoring estrogen replacement therapy following surgical menopause 07/07/2019  . 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 Rosacea 10/03/2016  . Major depressive disorder, recurrent episode, moderate (HCC) 10/03/2016  . Generalized anxiety disorder 10/03/2016  . Lumbar pain with radiation down right leg 05/13/2016  . 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 HYPERCHOLESTEROLEMIA 08/09/2010  . Migraine with aura and without status migrainosus, not intractable 08/09/2010  . Mild intermittent asthma without complication 08/09/2010  . GASTROESOPHAGEAL REFLUX DISEASE 08/09/2010  . URINARY INCONTINENCE 08/09/2010  . ELEVATED BP READING WITHOUT DX HYPERTENSION 08/09/2010    Past Surgical History:  Procedure Laterality Date  . ABDOMINAL HYSTERECTOMY    . OOPHORECTOMY    . TONSILLECTOMY AND ADENOIDECTOMY      OB History   No obstetric history on  file.      Home Medications    Prior to Admission medications   Medication Sig Start Date End Date Taking? Authorizing Provider  mometasone (NASONEX) 50 MCG/ACT nasal spray USE 2 SPRAYS IN EACH NOSTRIL ONCE DAILY AS DIRECTED 12/19/19  Yes Bedsole, Amy E, MD  omeprazole (PRILOSEC) 20 MG capsule Take 20 mg by mouth daily.   Yes [provider]  venlafaxine XR (EFFEXOR XR) 75 MG 24 hr capsule Take 1 capsule (75 mg total) by mouth daily with breakfast. 07/06/20  Yes Bedsole, Amy E, MD  eletriptan (RELPAX) 40 MG tablet TAKE ONE TABLET AS NEEDED FOR MIGRAINE OR HEADACHE. MAY REPEAT IN 2 HOURS IF HEADACHE PERSISTS OR RECURS 06/18/20   Bedsole, Amy E, MD  metroNIDAZOLE (METROGEL) 1 % gel Apply 1 application topically daily.  12/07/18   [provider]  SUMAtriptan (IMITREX) 50 MG tablet TAKE ONE TABLET EVERY 2 HOURS AS NEEDED FOR MIGRAINE 07/15/19   Excell Seltzer, MD    Family History Family History  Problem Relation Age of Onset  . Aneurysm Father   . Depression Mother   . Lupus Mother   . Sjogren's syndrome Mother   . Breast cancer Maternal Aunt     Social History Social History   Tobacco Use  . Smoking status: Never Smoker  . Smokeless tobacco: Never Used  Substance Use Topics  . Alcohol use: No  . Drug use: No     Allergies   Eggs or egg-derived products, Milk-related compounds, and Peanut-containing drug products  Review of Systems Review of Systems  Constitutional: Negative for chills and fever.  HENT: Negative for ear pain and sore throat.   Eyes: Negative for pain and visual disturbance.  Respiratory: Negative for cough and shortness of breath.   Cardiovascular: Negative for chest pain and palpitations.  Gastrointestinal: Positive for abdominal pain and nausea. Negative for constipation, diarrhea and vomiting.  Genitourinary: Negative for dysuria and hematuria.  Musculoskeletal: Negative for arthralgias and back pain.  Skin: Negative for color change  and rash.  Neurological: Negative for seizures and syncope.  All other systems reviewed and are negative.    Physical Exam Triage Vital Signs ED Triage Vitals  Enc Vitals Group     BP      Pulse      Resp      Temp      Temp src      SpO2      Weight      Height      Head Circumference      Peak Flow      Pain Score      Pain Loc      Pain Edu?      Excl. in GC?    No data found.  Updated Vital Signs BP (!) 144/96   Pulse 75   Temp 98.3 F (36.8 C)   Resp 16   SpO2 96%   Visual Acuity Right Eye Distance:   Left Eye Distance:   Bilateral Distance:    Right Eye Near:   Left Eye Near:    Bilateral Near:     Physical Exam Vitals and nursing note reviewed.  Constitutional:      General: She is not in acute distress.    Appearance: She is well-developed. She is obese. She is not ill-appearing.  HENT:     Head: Normocephalic and atraumatic.     Mouth/Throat:     Mouth: Mucous membranes are moist.     Pharynx: Oropharynx is clear.  Eyes:     Conjunctiva/sclera: Conjunctivae normal.  Cardiovascular:     Rate and Rhythm: Normal rate and regular rhythm.     Heart sounds: No murmur heard.   Pulmonary:     Effort: Pulmonary effort is normal. No respiratory distress.     Breath sounds: Normal breath sounds.  Abdominal:     General: Bowel sounds are normal. There is no distension.     Palpations: Abdomen is soft.     Tenderness: There is no abdominal tenderness. There is no right CVA tenderness, left CVA tenderness, guarding or rebound.  Musculoskeletal:     Cervical back: Neck supple.  Skin:    General: Skin is warm and dry.     Findings: No rash.  Neurological:     General: No focal deficit present.     Mental Status: She is alert and oriented to person, place, and time.     Gait: Gait normal.  Psychiatric:        Mood and Affect: Mood normal.        Behavior: Behavior normal.      UC Treatments / Results  Labs (all labs ordered are listed, but  only abnormal results are displayed) Labs Reviewed - No data to display  EKG   Radiology No results found.  Procedures Procedures (including critical care time)  Medications Ordered in UC Medications - No data to display  Initial Impression / Assessment and Plan / UC Course  I have reviewed  the triage vital signs and the nursing notes.  Pertinent labs & imaging results that were available during my care of the patient were reviewed by me and considered in my medical decision making (see chart for details).   RUQ abdominal pain.  Patient is well-appearing and her exam is reassuring.  Discussed limitation of evaluation of abdominal pain in an urgent care.  Instructed patient to go to the ED if she has acute abdominal pain or other concerning symptoms.  Patient agrees to plan of care.   Final Clinical Impressions(s) / UC Diagnoses   Final diagnoses:  RUQ abdominal pain     Discharge Instructions     Go to the emergency department if you have acute abdominal pain or other concerning symptoms.        ED Prescriptions    None     PDMP not reviewed this encounter.   Mickie Bail, NP 07/27/20 [phone]

## 2020-07-27 NOTE — ED Triage Notes (Signed)
Patient c/o RUQ abdominal pain gradual onset last night. Reports she is belching a lot this morning as well. Endorses nausea.

## 2020-07-27 NOTE — ED Triage Notes (Signed)
Patient c/o RUQ abdominal pain and nausea since yesterday afternoon.  Patient states she went to an UC in [place], but they were unable to do any imaging.

## 2020-07-27 NOTE — ED Provider Notes (Signed)
[facility]-EMERGENCY DEPT Provider Note   CSN: [id number] Arrival date & time: 07/27/20  1007     History Chief Complaint  Patient presents with  . Abdominal Pain    Mary Keith is a 44 y.o. female with a past medical history of migraines presenting to the ED with a chief complaint of abdominal pain.  Since yesterday has had 2 episodes of sharp right upper quadrant abdominal pain with associated nausea.  Yesterday's episode happened after she had eaten dinner.  She tried taking some Alka-Seltzer without significant improvement in her pain.  Woke up this morning with similar symptoms again, went to the urgent care but was sent to the ER for lab work and imaging.  No history of similar symptoms in the past, no history of gallstones that she is aware of.  Denies any fevers, changes to bowel movements or urination, vaginal complaints, chest pain, shortness of breath or cough.  No sick contacts with similar symptoms.  Did not take any medications for pain or nausea this morning. States that she is pain free right now.  HPI     Past Medical History:  Diagnosis Date  . Depressive disorder, not elsewhere classified   . Elevated blood pressure reading without diagnosis of hypertension   . Migraine without aura, without mention of intractable migraine without mention of status migrainosus   . Pure hypercholesterolemia   . Unspecified urinary incontinence     Patient Active Problem List   Diagnosis Date Noted  . 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 Encounter for monitoring estrogen replacement therapy following surgical menopause 07/07/2019  . 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 Rosacea 10/03/2016  . Major depressive disorder, recurrent episode, moderate (HCC) 10/03/2016  . Generalized anxiety disorder 10/03/2016  . Lumbar pain with radiation down right leg 05/13/2016  . 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  HYPERCHOLESTEROLEMIA 08/09/2010  . Migraine with aura and without status migrainosus, not intractable 08/09/2010  . Mild intermittent asthma without complication 08/09/2010  . GASTROESOPHAGEAL REFLUX DISEASE 08/09/2010  . URINARY INCONTINENCE 08/09/2010  . ELEVATED BP READING WITHOUT DX HYPERTENSION 08/09/2010    Past Surgical History:  Procedure Laterality Date  . ABDOMINAL HYSTERECTOMY    . OOPHORECTOMY    . TONSILLECTOMY AND ADENOIDECTOMY       OB History   No obstetric history on file.     Family History  Problem Relation Age of Onset  . Aneurysm Father   . Depression Mother   . Lupus Mother   . Sjogren's syndrome Mother   . Breast cancer Maternal Aunt     Social History   Tobacco Use  . Smoking status: Never Smoker  . Smokeless tobacco: Never Used  Vaping Use  . Vaping Use: Never used  Substance Use Topics  . Alcohol use: No  . Drug use: No    Home Medications Prior to Admission medications   Medication Sig Start Date End Date Taking? Authorizing Provider  eletriptan (RELPAX) 40 MG tablet TAKE ONE TABLET AS NEEDED FOR MIGRAINE OR HEADACHE. MAY REPEAT IN 2 HOURS IF HEADACHE PERSISTS OR RECURS Patient taking differently: Take 40 mg by mouth every 2 (two) hours as needed for migraine or headache.  06/18/20  Yes Bedsole, Amy E, MD  metroNIDAZOLE (METROGEL) 0.75 % gel Apply 1 application topically daily. Applies to face   Yes [provider]  mometasone (NASONEX) 50 MCG/ACT nasal spray USE 2 SPRAYS IN [facility]  NOSTRIL ONCE DAILY AS DIRECTED Patient taking differently: Place 2 sprays into the nose daily.  12/19/19  Yes Bedsole, Amy E, MD  omeprazole (PRILOSEC) 20 MG capsule Take 20 mg by mouth daily.   Yes [provider]  SUMAtriptan (IMITREX) 50 MG tablet TAKE ONE TABLET EVERY 2 HOURS AS NEEDED FOR MIGRAINE Patient taking differently: Take 50 mg by mouth every 2 (two) hours as needed for migraine.  07/15/19  Yes Bedsole, Amy E, MD  venlafaxine XR  (EFFEXOR XR) 75 MG 24 hr capsule Take 1 capsule (75 mg total) by mouth daily with breakfast. 07/06/20  Yes Bedsole, Amy E, MD  famotidine (PEPCID) 20 MG tablet Take 1 tablet (20 mg total) by mouth 2 (two) times daily. 07/27/20   Piercen Covino, PA-C    Allergies    Eggs or egg-derived products, Milk-related compounds, and Peanut-containing drug products  Review of Systems   Review of Systems  Constitutional: Negative for appetite change, chills and fever.  HENT: Negative for ear pain, rhinorrhea, sneezing and sore throat.   Eyes: Negative for photophobia and visual disturbance.  Respiratory: Negative for cough, chest tightness, shortness of breath and wheezing.   Cardiovascular: Negative for chest pain and palpitations.  Gastrointestinal: Positive for abdominal pain and nausea. Negative for blood in stool, constipation, diarrhea and vomiting.  Genitourinary: Negative for dysuria, hematuria and urgency.  Musculoskeletal: Negative for myalgias.  Skin: Negative for rash.  Neurological: Negative for dizziness, weakness and light-headedness.    Physical Exam Updated Vital Signs BP (!) 137/91 (BP Location: Right Arm)   Pulse 66   Temp 99 F (37.2 C) (Oral)   Resp 18   Ht 5' 6.5" (1.689 m)   Wt 104.3 kg   SpO2 96%   BMI 36.57 kg/m   Physical Exam Vitals and nursing note reviewed.  Constitutional:      General: She is not in acute distress.    Appearance: She is well-developed.  HENT:     Head: Normocephalic and atraumatic.     Nose: Nose normal.  Eyes:     General: No scleral icterus.       Left eye: No discharge.     Conjunctiva/sclera: Conjunctivae normal.  Cardiovascular:     Rate and Rhythm: Normal rate and regular rhythm.     Heart sounds: Normal heart sounds. No murmur heard.  No friction rub. No gallop.   Pulmonary:     Effort: Pulmonary effort is normal. No respiratory distress.     Breath sounds: Normal breath sounds.  Abdominal:     General: Bowel sounds are  normal. There is no distension.     Palpations: Abdomen is soft.     Tenderness: There is no abdominal tenderness. There is no guarding.  Musculoskeletal:        General: Normal range of motion.     Cervical back: Normal range of motion and neck supple.  Skin:    General: Skin is warm and dry.     Findings: No rash.  Neurological:     Mental Status: She is alert.     Motor: No abnormal muscle tone.     Coordination: Coordination normal.     ED Results / Procedures / Treatments   Labs (all labs ordered are listed, but only abnormal results are displayed) Labs Reviewed  COMPREHENSIVE METABOLIC PANEL - Abnormal; Notable for the following components:      Result Value   Creatinine, Ser 1.02 (*)    All other  components within normal limits  CBC - Abnormal; Notable for the following components:   Hemoglobin 15.1 (*)    All other components within normal limits  URINALYSIS, ROUTINE W REFLEX MICROSCOPIC - Abnormal; Notable for the following components:   Color, Urine STRAW (*)    All other components within normal limits  LIPASE, BLOOD    EKG None  Radiology US Abdomen Limited RUQ  Result Date: 07/27/2020 CLINICAL DATA:  Pain EXAM: ULTRASOUND ABDOMEN LIMITED RIGHT UPPER QUADRANT COMPARISON:  None. FINDINGS: Gallbladder: No gallstones or wall thickening visualized. No sonographic Murphy sign noted by sonographer. Common bile duct: Diameter: 4.6 mm. Liver: No focal lesion identified. Within normal limits in parenchymal echogenicity. Portal vein is patent on color Doppler imaging with normal direction of blood flow towards the liver. Other: None. IMPRESSION: Unremarkable right upper quadrant ultrasound. Electronically Signed   By: Stana Bunting M.D.   On: 07/27/2020 15:13    Procedures Procedures (including critical care time)  Medications Ordered in ED Medications - No data to display  ED Course  I have reviewed the triage vital signs and the nursing notes.  Pertinent  labs & imaging results that were available during my care of the patient were reviewed by me and considered in my medical decision making (see chart for details).   MDM Rules/Calculators/A&P                          44 year old female with a past medical history of migraines presenting to the ED with a chief complaint of abdominal pain.  Right upper quadrant abdominal pain x2 episodes since yesterday.  Associated nausea.  Yesterday's episode happened after she had eaten dinner but today's episode happened when she woke up.  Minimal improvement noted with Alka-Seltzer.  No fevers, chest pain, shortness of breath, changes to bowel movements.  On exam patient is currently pain-free, abdomen is soft, nontender nondistended.  She is overall well-appearing, afebrile without recent use of antipyretics.  Lab work including CMP, CBC unremarkable.  Lipase is normal.  No elevation in LFTs.  Urinalysis without evidence of infection.  Right upper quadrant ultrasound is negative for any abnormalities.  Suspect that symptoms could be due to gastritis versus viral cause.  No lower abdominal tenderness that concern me for appendicitis.  No evidence of cholecystitis or pancreatitis.  Patient is agreeable to following up with PCP and symptomatic treatment. Return precautions given.   Patient is hemodynamically stable, in NAD, and able to ambulate in the ED. Evaluation does not show pathology that would require ongoing emergent intervention or inpatient treatment. I explained the diagnosis to the patient. Pain has been managed and has no complaints prior to discharge. Patient is comfortable with above plan and is stable for discharge at this time. All questions were answered prior to disposition. Strict return precautions for returning to the ED were discussed. Encouraged follow up with PCP.   An After Visit Summary was printed and given to the patient.   Portions of this note were generated with [employer]. Dictation errors may occur despite best attempts at proofreading.  Final Clinical Impression(s) / ED Diagnoses Final diagnoses:  RUQ abdominal pain  Acute gastritis without hemorrhage, unspecified gastritis type    Rx / DC Orders ED Discharge Orders         Ordered    famotidine (PEPCID) 20 MG tablet  2 times daily        07/27/20 1538  Dietrich PatesKhatri, Janes Colegrove, PA-C 07/27/20 1539    Linwood DibblesKnapp, Jon, MD 07/27/20 [id number]

## 2020-07-27 NOTE — Telephone Encounter (Signed)
FYI  Pt called requesting visit with Bedsole today after being seen by UC and advised to go to ED... explained to pt that if she was advised to go to ED, she should go as we do not have any openings for today... She may need stat labs and/or diagnostic studies and would be best to go to ED as it is Friday... Advised pt to try [facility] long wait may not be as long as [facility]

## 2020-07-27 NOTE — Telephone Encounter (Signed)
Noted  

## 2020-07-30 ENCOUNTER — Other Ambulatory Visit: Payer: Self-pay | Admitting: Family Medicine

## 2020-07-31 NOTE — Telephone Encounter (Signed)
I spoke with pt; yesterday morning was last BP ck at 140/101. Pt did not ck BP this morning and pt is now at work; pt has not increased Na intake, pt has been under more stress than usual. Pt has H/A on and off; pt has hx of migraines; pt had h/a yesterday after riding exercise bike for 30 '.  Pt said migraine lasted until imitrex. No CP, SOB and no recent dizziness. No covid symptoms other than pt has felt tired but functioning the last few days;no travel and no known exposure ot + covid. Pt has had covid vaccines.  Now pt feels OK; pt does not have H/A at this time. Pt will rest, drink plenty of fluids and UC & ED precautions given and pt voiced understanding. Pt scheduled FU ED visit with Dr Ermalene Searing on 08/02/20 at 9:20. Pt will monitor BP until seen and cb if needed. Pt is on no BP medications. FYI to Dr Ermalene Searing.

## 2020-08-02 ENCOUNTER — Encounter: Payer: Self-pay | Admitting: Family Medicine

## 2020-08-02 ENCOUNTER — Ambulatory Visit: Payer: Managed Care, Other (non HMO) | Admitting: Family Medicine

## 2020-08-02 ENCOUNTER — Telehealth: Payer: Self-pay

## 2020-08-02 ENCOUNTER — Other Ambulatory Visit: Payer: Self-pay

## 2020-08-02 VITALS — BP 130/94 | HR 87 | Temp 98.1°F | Ht 66.5 in | Wt 227.8 lb

## 2020-08-02 DIAGNOSIS — K353 Acute appendicitis with localized peritonitis, without perforation or gangrene: Principal | ICD-10-CM | POA: Insufficient documentation

## 2020-08-02 DIAGNOSIS — R1031 Right lower quadrant pain: Secondary | ICD-10-CM | POA: Diagnosis not present

## 2020-08-02 DIAGNOSIS — Z91010 Allergy to peanuts: Secondary | ICD-10-CM | POA: Diagnosis not present

## 2020-08-02 DIAGNOSIS — K219 Gastro-esophageal reflux disease without esophagitis: Secondary | ICD-10-CM | POA: Insufficient documentation

## 2020-08-02 DIAGNOSIS — Z20822 Contact with and (suspected) exposure to covid-19: Secondary | ICD-10-CM | POA: Insufficient documentation

## 2020-08-02 DIAGNOSIS — I10 Essential (primary) hypertension: Secondary | ICD-10-CM | POA: Diagnosis not present

## 2020-08-02 DIAGNOSIS — G43109 Migraine with aura, not intractable, without status migrainosus: Secondary | ICD-10-CM | POA: Diagnosis not present

## 2020-08-02 DIAGNOSIS — R1011 Right upper quadrant pain: Secondary | ICD-10-CM | POA: Diagnosis present

## 2020-08-02 DIAGNOSIS — Z79899 Other long term (current) drug therapy: Secondary | ICD-10-CM | POA: Diagnosis not present

## 2020-08-02 DIAGNOSIS — Z85828 Personal history of other malignant neoplasm of skin: Secondary | ICD-10-CM | POA: Insufficient documentation

## 2020-08-02 LAB — COMPREHENSIVE METABOLIC PANEL
ALT: 29 U/L (ref 0–44)
AST: 24 U/L (ref 15–41)
Albumin: 4.6 g/dL (ref 3.5–5.0)
Alkaline Phosphatase: 72 U/L (ref 38–126)
Anion gap: 13 (ref 5–15)
BUN: 12 mg/dL (ref 6–20)
CO2: 25 mmol/L (ref 22–32)
Calcium: 9.7 mg/dL (ref 8.9–10.3)
Chloride: 101 mmol/L (ref 98–111)
Creatinine, Ser: 1.07 mg/dL — ABNORMAL HIGH (ref 0.44–1.00)
GFR calc Af Amer: >60 mL/min (ref >60)
GFR calc non Af Amer: >60 mL/min (ref >60)
Glucose, Bld: 102 mg/dL — ABNORMAL HIGH (ref 70–99)
Potassium: 4.7 mmol/L (ref 3.5–5.1)
Sodium: 139 mmol/L (ref 135–145)
Total Bilirubin: 0.9 mg/dL (ref 0.3–1.2)
Total Protein: 8 g/dL (ref 6.5–8.1)

## 2020-08-02 LAB — URINALYSIS, COMPLETE (UACMP) WITH MICROSCOPIC
Bacteria, UA: NONE SEEN
Bilirubin Urine: NEGATIVE
Glucose, UA: NEGATIVE mg/dL
Ketones, ur: 5 mg/dL — AB
Leukocytes,Ua: NEGATIVE
Nitrite: NEGATIVE
Protein, ur: NEGATIVE mg/dL
Specific Gravity, Urine: 1.003 — ABNORMAL LOW (ref 1.005–1.030)
pH: 6 (ref 5.0–8.0)

## 2020-08-02 LAB — CBC
HCT: 44.9 % (ref 36.0–46.0)
Hemoglobin: 15.7 g/dL — ABNORMAL HIGH (ref 12.0–15.0)
MCH: 31.1 pg (ref 26.0–34.0)
MCHC: 35 g/dL (ref 30.0–36.0)
MCV: 88.9 fL (ref 80.0–100.0)
Platelets: 367 10*3/uL (ref 150–400)
RBC: 5.05 MIL/uL (ref 3.87–5.11)
RDW: 12.2 % (ref 11.5–15.5)
WBC: 11.9 10*3/uL — ABNORMAL HIGH (ref 4.0–10.5)
nRBC: 0 % (ref 0.0–0.2)

## 2020-08-02 LAB — LIPASE, BLOOD: Lipase: 26 U/L (ref 11–51)

## 2020-08-02 MED ORDER — METOPROLOL SUCCINATE ER 25 MG PO TB24
25.0000 mg | ORAL_TABLET | Freq: Every day | ORAL | 11 refills | Status: DC
Start: 2020-08-02 — End: 2021-08-07

## 2020-08-02 MED ORDER — ONDANSETRON 4 MG PO TBDP
4.0000 mg | ORAL_TABLET | Freq: Once | ORAL | Status: AC | PRN
  Administered 2020-08-02: 4 mg via ORAL
  Filled 2020-08-02: qty 1

## 2020-08-02 MED ORDER — OXYCODONE-ACETAMINOPHEN 5-325 MG PO TABS
1.0000 | ORAL_TABLET | ORAL | Status: DC | PRN
  Administered 2020-08-02: 1 via ORAL
  Filled 2020-08-02: qty 1

## 2020-08-02 NOTE — ED Triage Notes (Addendum)
Pt arrived via pov from home. C/o right lower sided abd pain starting 1 week ago, increased in intensity today. Seen at [facility] long one week ago, where they completed an ultrasound to check gallbladder. Denies any burning, pain or bleeding with urination. Pt states at PCP today and they were concerned appendicitis. Pt tearful in triage. NAD noted at this time.

## 2020-08-02 NOTE — Telephone Encounter (Signed)
I spoke with pts husband; pt has not vomited anymore and pt is presently asleep.pt has not gone to ED yet; Pts husband said if pt condition changes or worsens he will take pt to UC or ED.  [name] hopes CT will be approved soon. FYI to Dr Ermalene Searing.

## 2020-08-02 NOTE — Assessment & Plan Note (Signed)
Pain has moved from  Right upper abdomen to periumbilical and RLQ, Rebound pain is present. Unremarkable CMET and cbc in ER, neg limited RUQ [place]. Neg UA.  DDX less likely gastritis as no epigastric pain... on H2 blocker, restart PPI at higher dose.  Possible appendicitis.Marland Kitchen eval with CT scan given rebound on exam, nighttime waking and pain moving to RLQ ? Adhesion pain given past TAH  No BM changes  hx of endometriosis.Marland Kitchen doubt cause given  S/P TAH, post menopausal

## 2020-08-02 NOTE — Telephone Encounter (Signed)
[facility] Day - Client TELEPHONE ADVICE RECORD Access[employer]urse Patient Name: Mary Keith Gender: Female DOB: [birth year] Age: 44 Y 4 M 13 D Return Phone Number: [phone] (Primary), [phone] (Secondary) Address[place] [address] Client [facility] Day - Client Client Site [facility] - Day Physician Kerby Nora - MD Contact Type Call Who Is Calling Patient / Member / Family / Caregiver Call Type Triage / Clinical Caller Name Alice Relationship To Patient Other Return Phone Number [phone] (Primary) Chief Complaint ABDOMINAL PAIN - Severe and only in abdomen Reason for Call Symptomatic / Request for Health Information Initial Comment Caller is from the office and is calling to have a patient triaged. She is having extreme abd pain and has an appt earlier today. Her pain has gotten worse since the earlier appt. Additional Comment The husband is on the phoine. His name is [name]. Translation No Nurse Assessment Nurse: Stefano Gaul, RN, Vera Date/Time (Eastern Time): 08/02/2020 1:30:29 PM Confirm and document reason for call. If symptomatic, describe symptoms. ---Caller states spouse had severe pain last Friday for severe abd pain. Ultrasound was negative. pain came back last night. Insurance would not approve CT scan. Pain is still on the right side. pain level 4 at rest and 9 with movement. vomited and is nauseated. no fever. Does the patient have any new or worsening symptoms? ---Yes Will a triage be completed? ---Yes Related visit to physician within the last 2 weeks? ---Yes Does the PT have any chronic conditions? (i.e. diabetes, asthma, this includes High risk factors for pregnancy, etc.) ---No Is the patient pregnant or possibly pregnant? (Ask all females between the ages of 105-55) ---No Is this a behavioral health or substance abuse call? ---No Guidelines Guideline Title  Affirmed Question Affirmed Notes Nurse Date/Time (Eastern Time) Abdominal Pain - Female [1] SEVERE pain (e.g., excruciating) AND [2] present > 1 hour Stefano Gaul, RN, Dwana Curd 08/02/2020 1:34:39 PM PLEASE NOTE: All timestamps contained within this report are represented as [place] Standard Time. CONFIDENTIALTY NOTICE: This fax transmission is intended only for the addressee. It contains information that is legally privileged, confidential or otherwise protected from use or disclosure. If you are not the intended recipient, you are strictly prohibited from reviewing, disclosing, copying using or disseminating any of this information or taking any action in reliance on or regarding this information. If you have received this fax in error, please notify us immediately by telephone so that we can arrange for its return to [place]. Phone: [phone], Toll-Free: [phone], Fax: [phone] Page: 2 of 2 Call Id: [id number] Disp. Time Lamount Cohen Time) Disposition Final User 08/02/2020 1:24:32 PM Send to Urgent Tessa Lerner 08/02/2020 1:37:58 PM Go to ED Now Yes Stefano Gaul, RN, Clerance Lav Disagree/Comply Comply Caller Understands Yes PreDisposition Call Doctor Care Advice Given Per Guideline GO TO ED NOW: * You need to be seen in the Emergency Department. ANOTHER ADULT SHOULD DRIVE: * It is better and safer if another adult drives instead of you. Referrals GO TO FACILITY UNDECIDED

## 2020-08-02 NOTE — Assessment & Plan Note (Signed)
Start metoloprolol Xl ( chose BBlocker given migraines).. follow BP at home... may be elevated in aprt due to pain.. but log shows elevation even when no abd pain

## 2020-08-02 NOTE — Patient Instructions (Addendum)
Restart omeprazole, increase to 2 tabs of 20 mg daily.  Continue famotidine, if symptoms improving  to as needed twice daily.   We will set up a Abd CT to evaluate for appendicitis   GO to ER if severe pain, fever or intractable vomiting. Start metoprolol Xl once daily.  Follow BP at home.Marland Kitchen goal < 140/90 ideally 120/70.

## 2020-08-02 NOTE — Progress Notes (Signed)
Chief Complaint  Patient presents with  . Follow-up    ER-URQ Pain    History of Present Illness: HPI    44 year old female presents for ER follow up... seen on 9/24 for RUQ pain.Marland Kitchen dx with gastritis vs viral illness. Pain was 9/10  Lab work including CMP, CBC unremarkable.  Lipase is normal.  No elevation in LFTs.  Urinalysis without evidence of infection.  Right upper quadrant ultrasound is negative for any abnormalities.  Note reviewed in detail.  Started on famotidine twice daily.  She wasn't sure what to do so stopped her. omeprazole  20 mg daily.  She has been using a probiotic.  Has been using som ibuprofen occ.   H x of endometriosis and abd surgeries. Has appendix.  She reports she was doing well until this AM.. had a recurrence of abdominal pain. Sudden onset in sleep, lower down than previously... RLQ  No N/V D/C noFever.  Pain 5/10. Pain with jiggling abdomen.  she has been burping a lot.   BP was 137/91 At home on 07/30/2020 BP was 140/101, 131/101, 128/98 BP Readings from Last 3 Encounters:  08/02/20 (!) 130/94  07/27/20 (!) 138/92  07/27/20 (!) 144/96   Migraines less frequent on venlafxine.  This visit occurred during the SARS-CoV-2 public health emergency.  Safety protocols were in place, including screening questions prior to the visit, additional usage of staff PPE, and extensive cleaning of exam room while observing appropriate contact time as indicated for disinfecting solutions.   COVID 19 screen:  No recent travel or known exposure to COVID19 The patient denies respiratory symptoms of COVID 19 at this time. The importance of social distancing was discussed today.     Review of Systems  Constitutional: Negative for chills and fever.  HENT: Negative for congestion and ear pain.   Eyes: Negative for pain and redness.  Respiratory: Negative for cough and shortness of breath.   Cardiovascular: Negative for chest pain, palpitations and leg swelling.   Gastrointestinal: Positive for abdominal pain. Negative for blood in stool, constipation, diarrhea, nausea and vomiting.       Burping  Genitourinary: Negative for dysuria.  Musculoskeletal: Negative for falls and myalgias.  Skin: Negative for rash.  Neurological: Negative for dizziness.  Psychiatric/Behavioral: Negative for depression. The patient is not nervous/anxious.       Past Medical History:  Diagnosis Date  . Depressive disorder, not elsewhere classified   . Elevated blood pressure reading without diagnosis of hypertension   . Migraine without aura, without mention of intractable migraine without mention of status migrainosus   . Pure hypercholesterolemia   . Unspecified urinary incontinence     reports that she has never smoked. She has never used smokeless tobacco. She reports that she does not drink alcohol and does not use drugs.   Current Outpatient Medications:  .  eletriptan (RELPAX) 40 MG tablet, TAKE ONE TABLET AS NEEDED FOR MIGRAINE OR HEADACHE. MAY REPEAT IN 2 HOURS IF HEADACHE PERSISTS OR RECURS, Disp: 10 tablet, Rfl: 2 .  famotidine (PEPCID) 20 MG tablet, Take 1 tablet (20 mg total) by mouth 2 (two) times daily., Disp: 30 tablet, Rfl: 0 .  metroNIDAZOLE (METROGEL) 0.75 % gel, Apply 1 application topically daily. Applies to face, Disp: , Rfl:  .  mometasone (NASONEX) 50 MCG/ACT nasal spray, USE 2 SPRAYS IN EACH NOSTRIL ONCE DAILY AS DIRECTED, Disp: 17 g, Rfl: 5 .  omeprazole (PRILOSEC) 20 MG capsule, Take 20 mg by mouth daily.,  Disp: , Rfl:  .  SUMAtriptan (IMITREX) 50 MG tablet, TAKE ONE TABLET EVERY 2 HOURS AS NEEDED FOR MIGRAINE, Disp: 9 tablet, Rfl: 2 .  venlafaxine XR (EFFEXOR XR) 75 MG 24 hr capsule, Take 1 capsule (75 mg total) by mouth daily with breakfast., Disp: 30 capsule, Rfl: 5   Observations/Objective: Blood pressure (!) 130/94, pulse 87, temperature 98.1 F (36.7 C), temperature source Temporal, height 5' 6.5" (1.689 m), weight 227 lb 12 oz (103.3  kg), SpO2 98 %.  Physical Exam Constitutional:      General: She is not in acute distress.    Appearance: Normal appearance. She is well-developed. She is obese. She is not ill-appearing or toxic-appearing.  HENT:     Head: Normocephalic.     Right Ear: Hearing, tympanic membrane, ear canal and external ear normal. Tympanic membrane is not erythematous, retracted or bulging.     Left Ear: Hearing, tympanic membrane, ear canal and external ear normal. Tympanic membrane is not erythematous, retracted or bulging.     Nose: No mucosal edema or rhinorrhea.     Right Sinus: No maxillary sinus tenderness or frontal sinus tenderness.     Left Sinus: No maxillary sinus tenderness or frontal sinus tenderness.     Mouth/Throat:     Pharynx: Uvula midline.  Eyes:     General: Lids are normal. Lids are everted, no foreign bodies appreciated.     Conjunctiva/sclera: Conjunctivae normal.     Pupils: Pupils are equal, round, and reactive to light.  Neck:     Thyroid: No thyroid mass or thyromegaly.     Vascular: No carotid bruit.     Trachea: Trachea normal.  Cardiovascular:     Rate and Rhythm: Normal rate and regular rhythm.     Pulses: Normal pulses.     Heart sounds: Normal heart sounds, S1 normal and S2 normal. No murmur heard.  No friction rub. No gallop.   Pulmonary:     Effort: Pulmonary effort is normal. No tachypnea or respiratory distress.     Breath sounds: Normal breath sounds. No decreased breath sounds, wheezing, rhonchi or rales.  Abdominal:     General: Bowel sounds are normal.     Palpations: Abdomen is soft.     Tenderness: There is abdominal tenderness in the right lower quadrant and periumbilical area. There is rebound. There is no right CVA tenderness, left CVA tenderness or guarding.     Hernia: No hernia is present.  Musculoskeletal:     Cervical back: Normal range of motion and neck supple.  Skin:    General: Skin is warm and dry.     Findings: No rash.   Neurological:     Mental Status: She is alert.  Psychiatric:        Mood and Affect: Mood is not anxious or depressed.        Speech: Speech normal.        Behavior: Behavior normal. Behavior is cooperative.        Thought Content: Thought content normal.        Judgment: Judgment normal.      Assessment and Plan   Abdominal pain, RLQ Pain has moved from  Right upper abdomen to periumbilical and RLQ, Rebound pain is present. Unremarkable CMET and cbc in ER, neg limited RUQ [place]. Neg UA.  DDX less likely gastritis as no epigastric pain... on H2 blocker, restart PPI at higher dose.  Possible appendicitis.Marland Kitchen eval with CT scan given rebound  on exam, nighttime waking and pain moving to RLQ ? Adhesion pain given past TAH  No BM changes  hx of endometriosis.Marland Kitchen doubt cause given  S/P TAH, post menopausal  HTN (hypertension)  Start metoloprolol Xl ( chose BBlocker given migraines).. follow BP at home... may be elevated in aprt due to pain.. but log shows elevation even when no abd pain  Migraine with aura and without status migrainosus, not intractable Improving.     Kerby Nora, MD

## 2020-08-02 NOTE — Assessment & Plan Note (Signed)
Improving.

## 2020-08-03 ENCOUNTER — Observation Stay: Payer: Managed Care, Other (non HMO)

## 2020-08-03 ENCOUNTER — Encounter
Admission: EM | Disposition: A | Discharge status: Home / Self Care | Payer: Self-pay | Source: Home / Self Care | Attending: Emergency Medicine

## 2020-08-03 ENCOUNTER — Observation Stay
Admission: EM | Admit: 2020-08-03 | Discharge: 2020-08-04 | Disposition: A | Discharge status: Home / Self Care | Payer: Managed Care, Other (non HMO) | Attending: General Surgery | Admitting: General Surgery

## 2020-08-03 ENCOUNTER — Emergency Department: Payer: Managed Care, Other (non HMO)

## 2020-08-03 DIAGNOSIS — K3580 Unspecified acute appendicitis: Secondary | ICD-10-CM

## 2020-08-03 DIAGNOSIS — K353 Acute appendicitis with localized peritonitis, without perforation or gangrene: Secondary | ICD-10-CM | POA: Diagnosis present

## 2020-08-03 HISTORY — PX: XI ROBOTIC LAPAROSCOPIC ASSISTED APPENDECTOMY: SHX6877

## 2020-08-03 HISTORY — DX: Essential (primary) hypertension: I10

## 2020-08-03 LAB — RESPIRATORY PANEL BY RT PCR (FLU A&B, COVID)
Influenza A by PCR: NEGATIVE
Influenza B by PCR: NEGATIVE
SARS Coronavirus 2 by RT PCR: NEGATIVE

## 2020-08-03 LAB — HIV ANTIBODY (ROUTINE TESTING W REFLEX): HIV Screen 4th Generation wRfx: NONREACTIVE

## 2020-08-03 IMAGING — CT CT ABD-PELV W/ CM
2 of 5 series · 16 of 46 positions shown, 18 images · IV contrast (APPLIED)
Comparison: Ultrasound abdomen [DATE]

CLINICAL DATA: Right lower quadrant abdominal pain with suspicion
of appendicitis. Pain started 1 week ago but increased today.

EXAM:
CT ABDOMEN AND PELVIS WITH CONTRAST
TECHNIQUE: Multidetector CT imaging of the abdomen and pelvis was performed
using the standard protocol following bolus administration of
intravenous contrast.
CONTRAST:  100mL OMNIPAQUE IOHEXOL 300 MG/ML  SOLN

[Series 2: routine abd/pel with · axial · 0.74mm/px · z∈[-1052,-597]mm · 13 of 103 slices shown, 15 images]
[im 6/103  soft-tissue]
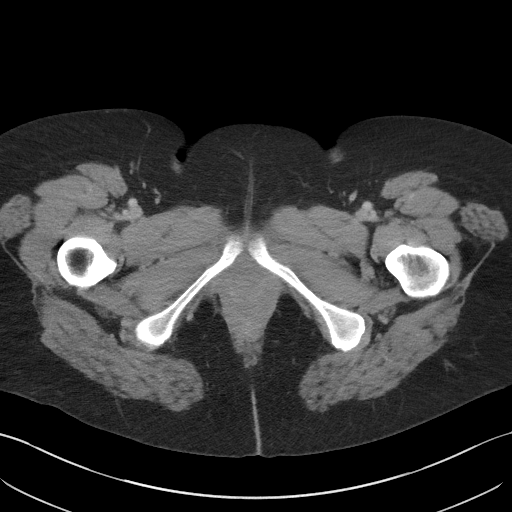
[im 6/103  bone]
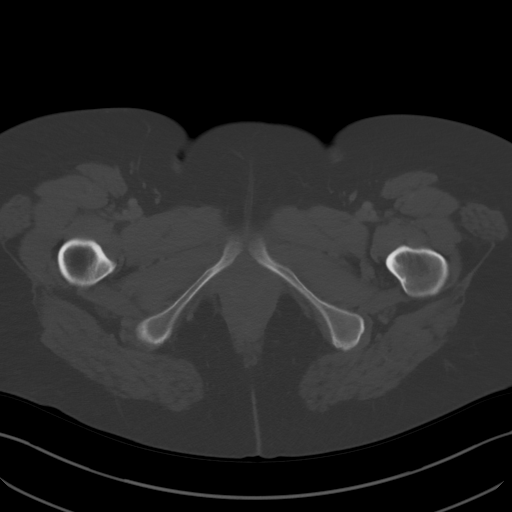
[im 17/103  soft-tissue]
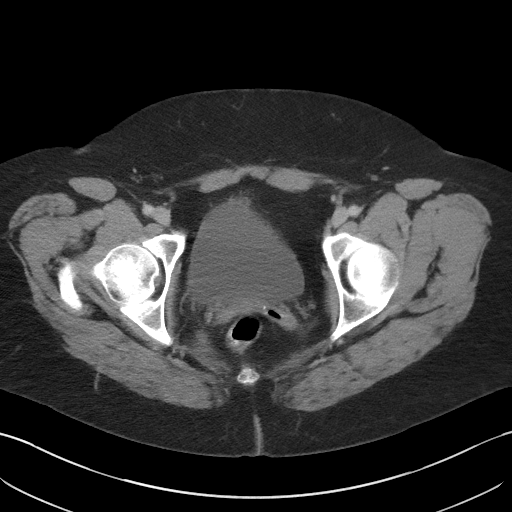
[im 22/103  soft-tissue]
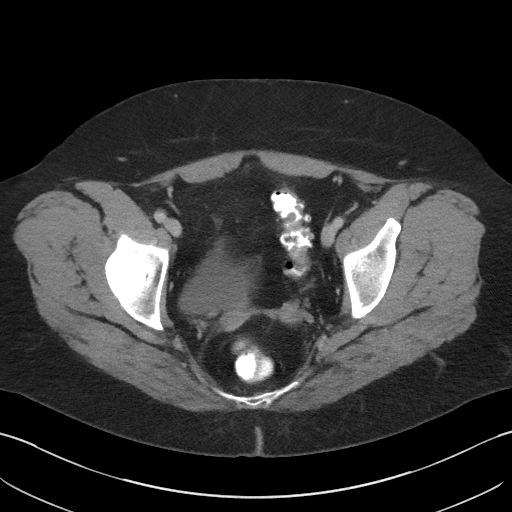
[im 27/103  soft-tissue]
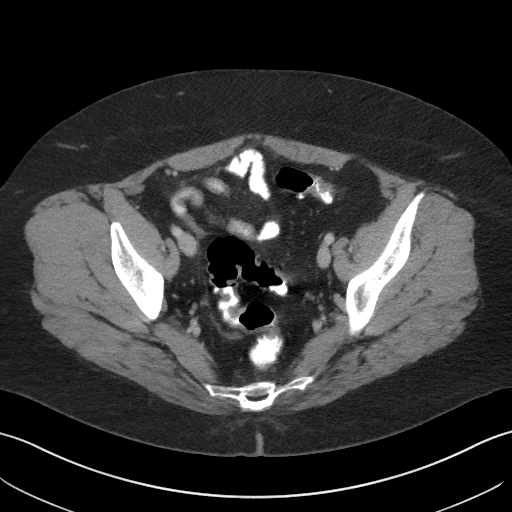
[im 38/103  soft-tissue]
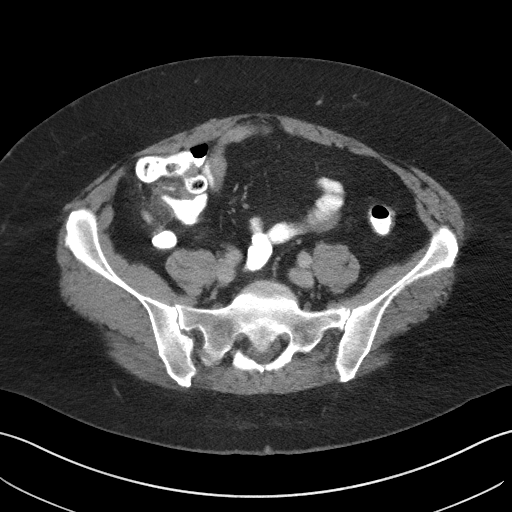
[im 43/103  soft-tissue]
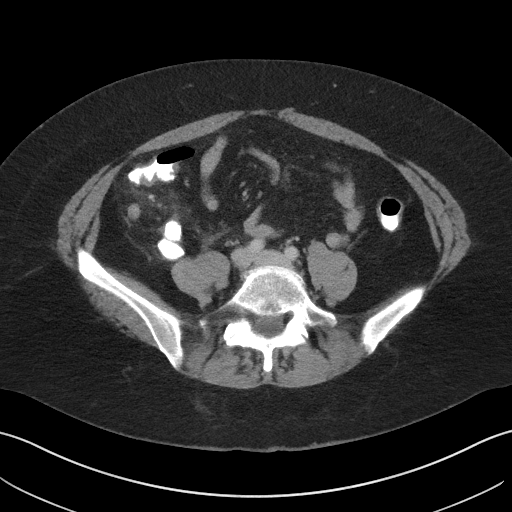
[im 54/103  soft-tissue]
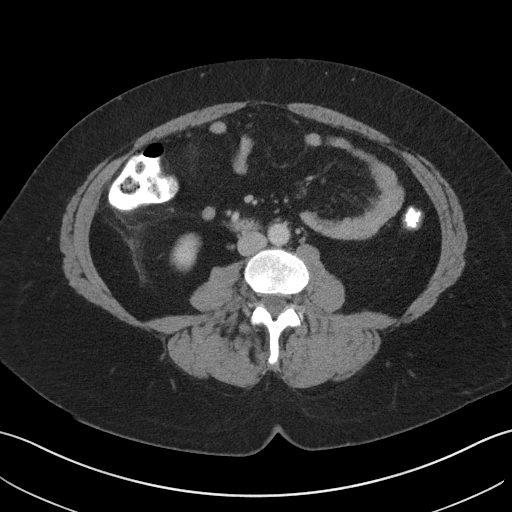
[im 60/103  soft-tissue]
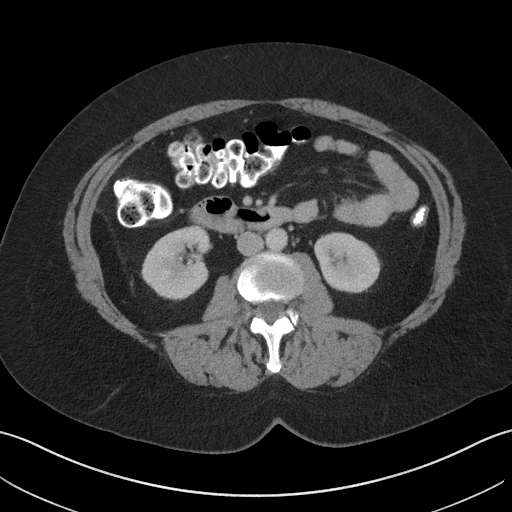
[im 65/103  soft-tissue]
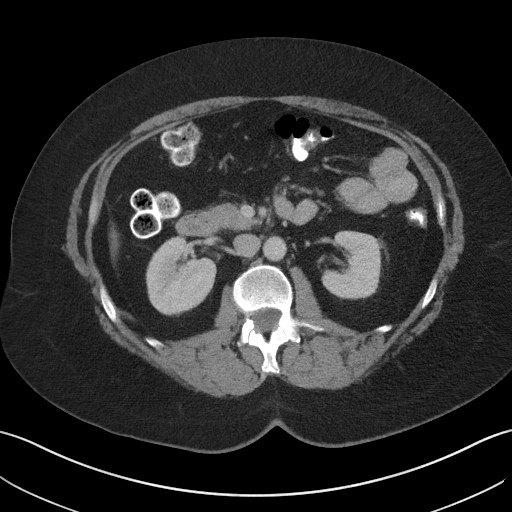
[im 65/103  bone]
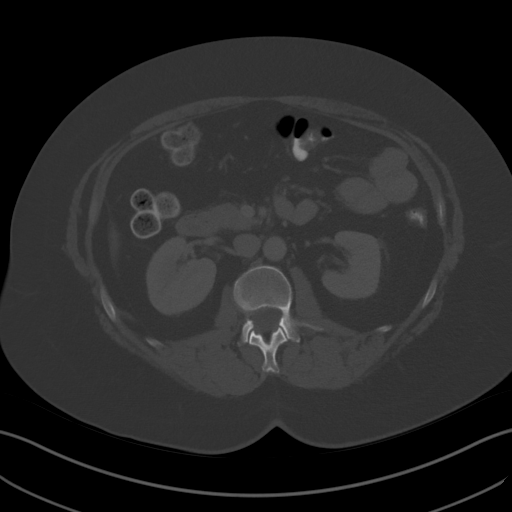
[im 76/103  soft-tissue]
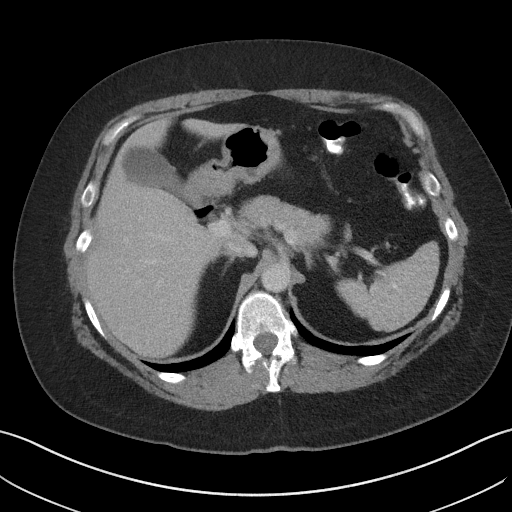
[im 81/103  soft-tissue]
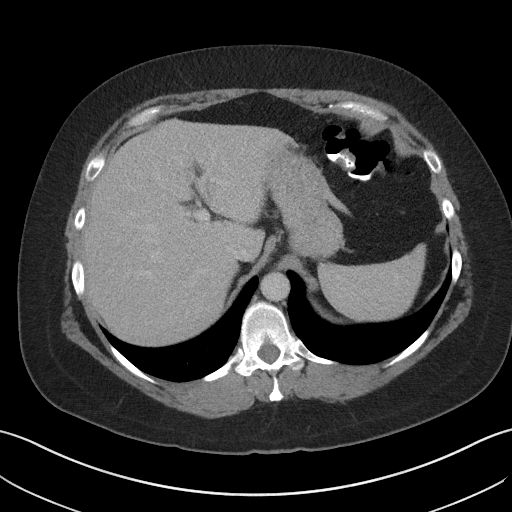
[im 86/103  soft-tissue]
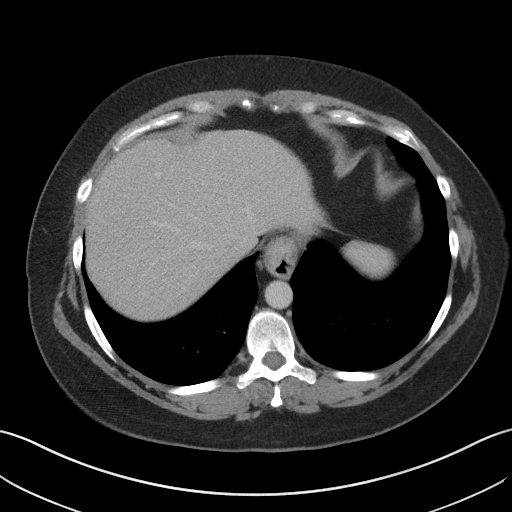
[im 97/103  soft-tissue]
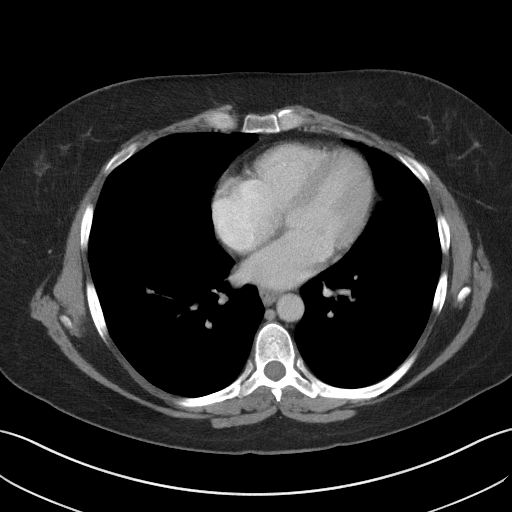

[Series 5: coronal st · coronal · 0.84mm/px · 3 of 105 slices shown]
[im 35/105  soft-tissue]
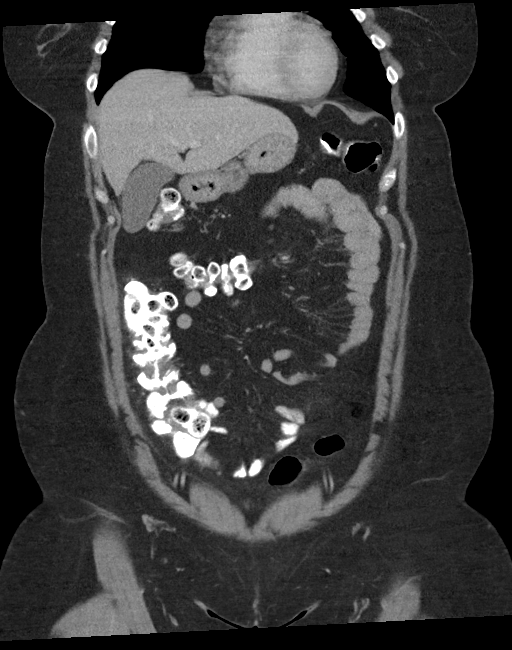
[im 47/105  soft-tissue]
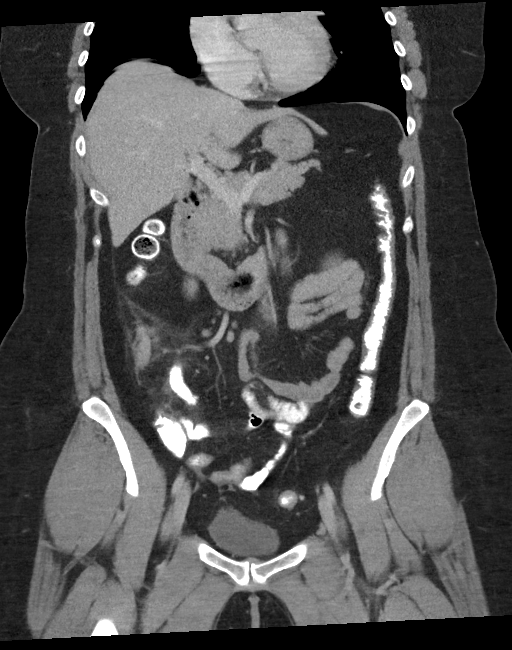
[im 58/105  soft-tissue]
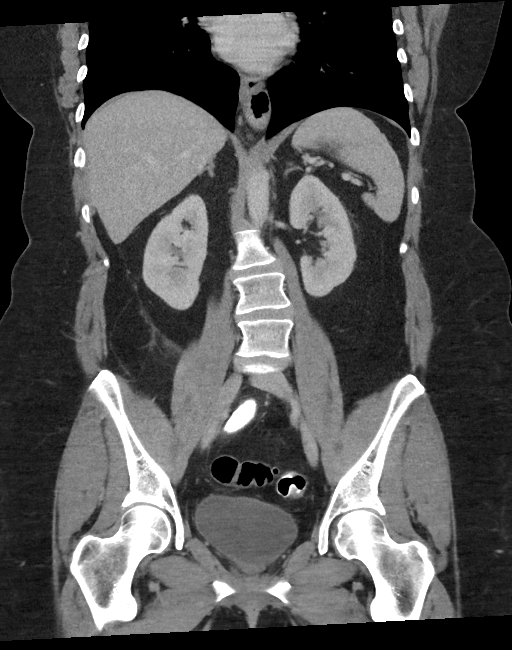

[16 of 46 positions shown; findings below may reference images not displayed]

FINDINGS: Lower chest: Dependent atelectasis in the lung bases. Small
esophageal hiatal hernia.

Hepatobiliary: No focal liver abnormality is seen. No gallstones,
gallbladder wall thickening, or biliary dilatation.

Pancreas: Unremarkable. No pancreatic ductal dilatation or
surrounding inflammatory changes.

Spleen: Normal in size without focal abnormality.

Adrenals/Urinary Tract: Adrenal glands are unremarkable. Kidneys are
normal, without renal calculi, focal lesion, or hydronephrosis.
Bladder is unremarkable.

Stomach/Bowel: Stomach, small bowel, and colon are not abnormally
distended. Contrast material flows to the rectum without evidence of
obstruction. The tip of the appendix is distended and thick walled
with diameter measuring up to 1.5 cm. There is periappendiceal
stranding mostly around the tip of the appendix. No abscess. This is
consistent with tip appendicitis.

Appendix: Location: Retrocecal

Diameter: 15 mm

Appendicolith: No

Mucosal hyper-enhancement: No

Extraluminal gas: No

Periappendiceal collection: No collection. Prominent periappendiceal
stranding.

Vascular/Lymphatic: No significant vascular findings are present. No
enlarged abdominal or pelvic lymph nodes.

Reproductive: Status post hysterectomy. No adnexal masses.

Other: No free air or free fluid in the abdomen. Abdominal wall
musculature appears intact.

Musculoskeletal: Spondylolysis with minimal spondylolisthesis at
L4-5. No destructive bone lesions.
IMPRESSION: 1. Findings consistent with tip appendicitis. No abscess.
2. Small esophageal hiatal hernia.
3. Spondylolysis with minimal spondylolisthesis at L4-5.

## 2020-08-03 SURGERY — APPENDECTOMY, ROBOT-ASSISTED, LAPAROSCOPIC
Anesthesia: General | Site: Abdomen

## 2020-08-03 MED ORDER — LIDOCAINE-EPINEPHRINE (PF) 1 %-1:200000 IJ SOLN
INTRAMUSCULAR | Status: DC | PRN
  Administered 2020-08-03: 20 mL

## 2020-08-03 MED ORDER — FLUTICASONE PROPIONATE 50 MCG/ACT NA SUSP
1.0000 | Freq: Every day | NASAL | Status: DC
  Administered 2020-08-04: 1 via NASAL
  Filled 2020-08-03: qty 16

## 2020-08-03 MED ORDER — ONDANSETRON HCL 4 MG/2ML IJ SOLN
INTRAMUSCULAR | Status: AC
  Filled 2020-08-03: qty 2

## 2020-08-03 MED ORDER — PANTOPRAZOLE SODIUM 40 MG PO TBEC
40.0000 mg | DELAYED_RELEASE_TABLET | Freq: Every day | ORAL | Status: DC
  Administered 2020-08-03 – 2020-08-04 (×2): 40 mg via ORAL
  Filled 2020-08-03 (×2): qty 1

## 2020-08-03 MED ORDER — GLYCOPYRROLATE 0.2 MG/ML IJ SOLN
INTRAMUSCULAR | Status: DC | PRN
  Administered 2020-08-03: .2 mg via INTRAVENOUS

## 2020-08-03 MED ORDER — CHLORHEXIDINE GLUCONATE 0.12 % MT SOLN
OROMUCOSAL | Status: AC
  Administered 2020-08-03: 15 mL via OROMUCOSAL
  Filled 2020-08-03: qty 15

## 2020-08-03 MED ORDER — FAMOTIDINE 20 MG PO TABS
ORAL_TABLET | ORAL | Status: AC
  Administered 2020-08-03: 20 mg via ORAL
  Filled 2020-08-03: qty 1

## 2020-08-03 MED ORDER — MIDAZOLAM HCL 2 MG/2ML IJ SOLN
INTRAMUSCULAR | Status: DC | PRN
  Administered 2020-08-03: 2 mg via INTRAVENOUS

## 2020-08-03 MED ORDER — METOPROLOL SUCCINATE ER 25 MG PO TB24
25.0000 mg | ORAL_TABLET | Freq: Every day | ORAL | Status: DC
  Administered 2020-08-03 – 2020-08-04 (×2): 25 mg via ORAL
  Filled 2020-08-03 (×2): qty 1

## 2020-08-03 MED ORDER — DEXMEDETOMIDINE (PRECEDEX) IN NS 20 MCG/5ML (4 MCG/ML) IV SYRINGE
PREFILLED_SYRINGE | INTRAVENOUS | Status: DC | PRN
  Administered 2020-08-03: 8 ug via INTRAVENOUS

## 2020-08-03 MED ORDER — ELETRIPTAN HYDROBROMIDE 40 MG PO TABS
40.0000 mg | ORAL_TABLET | ORAL | Status: DC | PRN
  Filled 2020-08-03: qty 1

## 2020-08-03 MED ORDER — LIDOCAINE HCL (CARDIAC) PF 100 MG/5ML IV SOSY
PREFILLED_SYRINGE | INTRAVENOUS | Status: DC | PRN
  Administered 2020-08-03: 100 mg via INTRAVENOUS

## 2020-08-03 MED ORDER — FENTANYL CITRATE (PF) 100 MCG/2ML IJ SOLN
INTRAMUSCULAR | Status: AC
  Filled 2020-08-03: qty 2

## 2020-08-03 MED ORDER — BUPIVACAINE HCL (PF) 0.5 % IJ SOLN
INTRAMUSCULAR | Status: AC
  Filled 2020-08-03: qty 30

## 2020-08-03 MED ORDER — MORPHINE SULFATE (PF) 4 MG/ML IV SOLN
4.0000 mg | INTRAVENOUS | Status: DC | PRN
  Administered 2020-08-03: 4 mg via INTRAVENOUS
  Filled 2020-08-03: qty 1

## 2020-08-03 MED ORDER — FENTANYL CITRATE (PF) 100 MCG/2ML IJ SOLN
INTRAMUSCULAR | Status: DC | PRN
Start: 2020-08-03 — End: 2020-08-03
  Administered 2020-08-03 (×3): 50 ug via INTRAVENOUS

## 2020-08-03 MED ORDER — SUGAMMADEX SODIUM 500 MG/5ML IV SOLN
INTRAVENOUS | Status: DC | PRN
  Administered 2020-08-03: 500 mg via INTRAVENOUS

## 2020-08-03 MED ORDER — HYDROCODONE-ACETAMINOPHEN 5-325 MG PO TABS
1.0000 | ORAL_TABLET | ORAL | Status: DC | PRN
  Administered 2020-08-03 – 2020-08-04 (×3): 1 via ORAL
  Filled 2020-08-03 (×3): qty 1

## 2020-08-03 MED ORDER — SUMATRIPTAN SUCCINATE 50 MG PO TABS
50.0000 mg | ORAL_TABLET | ORAL | Status: DC | PRN
  Filled 2020-08-03: qty 1

## 2020-08-03 MED ORDER — ONDANSETRON HCL 4 MG/2ML IJ SOLN
4.0000 mg | Freq: Once | INTRAMUSCULAR | Status: AC
  Administered 2020-08-03: 4 mg via INTRAVENOUS
  Filled 2020-08-03: qty 2

## 2020-08-03 MED ORDER — ONDANSETRON HCL 4 MG/2ML IJ SOLN
4.0000 mg | Freq: Four times a day (QID) | INTRAMUSCULAR | Status: DC | PRN
  Administered 2020-08-03: 4 mg via INTRAVENOUS
  Filled 2020-08-03: qty 2

## 2020-08-03 MED ORDER — ACETAMINOPHEN 650 MG RE SUPP: 650.0000 mg | Freq: Four times a day (QID) | RECTAL | Status: DC | PRN

## 2020-08-03 MED ORDER — SUCCINYLCHOLINE CHLORIDE 20 MG/ML IJ SOLN
INTRAMUSCULAR | Status: DC | PRN
  Administered 2020-08-03: 140 mg via INTRAVENOUS

## 2020-08-03 MED ORDER — VENLAFAXINE HCL ER 75 MG PO CP24
75.0000 mg | ORAL_CAPSULE | Freq: Every day | ORAL | Status: DC
  Administered 2020-08-03 – 2020-08-04 (×2): 75 mg via ORAL
  Filled 2020-08-03 (×2): qty 1

## 2020-08-03 MED ORDER — PROPOFOL 10 MG/ML IV BOLUS
INTRAVENOUS | Status: AC
  Filled 2020-08-03: qty 40

## 2020-08-03 MED ORDER — LACTATED RINGERS IV SOLN: INTRAVENOUS | Status: DC

## 2020-08-03 MED ORDER — ONDANSETRON 4 MG PO TBDP: 4.0000 mg | ORAL_TABLET | Freq: Four times a day (QID) | ORAL | Status: DC | PRN

## 2020-08-03 MED ORDER — FAMOTIDINE 20 MG PO TABS: 20.0000 mg | ORAL_TABLET | Freq: Once | ORAL | Status: AC

## 2020-08-03 MED ORDER — PIPERACILLIN-TAZOBACTAM 3.375 G IVPB 30 MIN
3.3750 g | Freq: Once | INTRAVENOUS | Status: AC
  Administered 2020-08-03: 3.375 g via INTRAVENOUS
  Filled 2020-08-03: qty 50

## 2020-08-03 MED ORDER — SUCCINYLCHOLINE CHLORIDE 200 MG/10ML IV SOSY
PREFILLED_SYRINGE | INTRAVENOUS | Status: AC
  Filled 2020-08-03: qty 10

## 2020-08-03 MED ORDER — ORAL CARE MOUTH RINSE: 15.0000 mL | Freq: Once | OROMUCOSAL | Status: AC

## 2020-08-03 MED ORDER — ROCURONIUM BROMIDE 100 MG/10ML IV SOLN
INTRAVENOUS | Status: DC | PRN
  Administered 2020-08-03: 60 mg via INTRAVENOUS
  Administered 2020-08-03: 10 mg via INTRAVENOUS

## 2020-08-03 MED ORDER — PIPERACILLIN-TAZOBACTAM 3.375 G IVPB
INTRAVENOUS | Status: AC
  Filled 2020-08-03: qty 50

## 2020-08-03 MED ORDER — PROMETHAZINE HCL 25 MG/ML IJ SOLN: 6.2500 mg | INTRAMUSCULAR | Status: DC | PRN

## 2020-08-03 MED ORDER — ROCURONIUM BROMIDE 10 MG/ML (PF) SYRINGE
PREFILLED_SYRINGE | INTRAVENOUS | Status: AC
  Filled 2020-08-03: qty 10

## 2020-08-03 MED ORDER — ONDANSETRON HCL 4 MG/2ML IJ SOLN
INTRAMUSCULAR | Status: DC | PRN
  Administered 2020-08-03 (×2): 4 mg via INTRAVENOUS

## 2020-08-03 MED ORDER — IOHEXOL 9 MG/ML PO SOLN: 1000.0000 mL | Freq: Once | ORAL | Status: DC

## 2020-08-03 MED ORDER — PIPERACILLIN-TAZOBACTAM 3.375 G IVPB
3.3750 g | Freq: Three times a day (TID) | INTRAVENOUS | Status: DC
  Administered 2020-08-03 – 2020-08-04 (×4): 3.375 g via INTRAVENOUS
  Filled 2020-08-03 (×3): qty 50

## 2020-08-03 MED ORDER — CHLORHEXIDINE GLUCONATE 0.12 % MT SOLN: 15.0000 mL | Freq: Once | OROMUCOSAL | Status: AC

## 2020-08-03 MED ORDER — LIDOCAINE-EPINEPHRINE (PF) 1 %-1:200000 IJ SOLN
INTRAMUSCULAR | Status: AC
  Filled 2020-08-03: qty 30

## 2020-08-03 MED ORDER — MIDAZOLAM HCL 2 MG/2ML IJ SOLN
INTRAMUSCULAR | Status: AC
  Filled 2020-08-03: qty 2

## 2020-08-03 MED ORDER — ACETAMINOPHEN 10 MG/ML IV SOLN
INTRAVENOUS | Status: AC
  Filled 2020-08-03: qty 100

## 2020-08-03 MED ORDER — KETOROLAC TROMETHAMINE 30 MG/ML IJ SOLN
INTRAMUSCULAR | Status: DC | PRN
  Administered 2020-08-03: 30 mg via INTRAVENOUS

## 2020-08-03 MED ORDER — DEXAMETHASONE SODIUM PHOSPHATE 10 MG/ML IJ SOLN
INTRAMUSCULAR | Status: AC
  Filled 2020-08-03: qty 1

## 2020-08-03 MED ORDER — ENOXAPARIN SODIUM 40 MG/0.4ML ~~LOC~~ SOLN
40.0000 mg | SUBCUTANEOUS | Status: DC
  Administered 2020-08-03: 40 mg via SUBCUTANEOUS
  Filled 2020-08-03: qty 0.4

## 2020-08-03 MED ORDER — PHENYLEPHRINE HCL (PRESSORS) 10 MG/ML IV SOLN
INTRAVENOUS | Status: DC | PRN
  Administered 2020-08-03 (×2): 100 ug via INTRAVENOUS

## 2020-08-03 MED ORDER — LACTATED RINGERS IV BOLUS
1000.0000 mL | Freq: Once | INTRAVENOUS | Status: AC
  Administered 2020-08-03: 1000 mL via INTRAVENOUS

## 2020-08-03 MED ORDER — ACETAMINOPHEN 325 MG PO TABS
650.0000 mg | ORAL_TABLET | Freq: Four times a day (QID) | ORAL | Status: DC | PRN
  Administered 2020-08-03 – 2020-08-04 (×2): 650 mg via ORAL
  Filled 2020-08-03 (×3): qty 2

## 2020-08-03 MED ORDER — SODIUM CHLORIDE 0.9 % IV SOLN
INTRAVENOUS | Status: DC | PRN
  Administered 2020-08-03: 250 mL via INTRAVENOUS

## 2020-08-03 MED ORDER — ACETAMINOPHEN 10 MG/ML IV SOLN
INTRAVENOUS | Status: DC | PRN
  Administered 2020-08-03: 1000 mg via INTRAVENOUS

## 2020-08-03 MED ORDER — LIDOCAINE HCL (PF) 2 % IJ SOLN
INTRAMUSCULAR | Status: AC
  Filled 2020-08-03: qty 5

## 2020-08-03 MED ORDER — PROPOFOL 10 MG/ML IV BOLUS
INTRAVENOUS | Status: DC | PRN
  Administered 2020-08-03: 170 mg via INTRAVENOUS

## 2020-08-03 MED ORDER — MORPHINE SULFATE (PF) 4 MG/ML IV SOLN
4.0000 mg | Freq: Once | INTRAVENOUS | Status: AC
  Administered 2020-08-03: 4 mg via INTRAVENOUS
  Filled 2020-08-03: qty 1

## 2020-08-03 MED ORDER — IOHEXOL 300 MG/ML  SOLN
100.0000 mL | Freq: Once | INTRAMUSCULAR | Status: AC | PRN
  Administered 2020-08-03: 100 mL via INTRAVENOUS

## 2020-08-03 MED ORDER — DEXAMETHASONE SODIUM PHOSPHATE 10 MG/ML IJ SOLN
INTRAMUSCULAR | Status: DC | PRN
  Administered 2020-08-03: 10 mg via INTRAVENOUS

## 2020-08-03 MED ORDER — FENTANYL CITRATE (PF) 100 MCG/2ML IJ SOLN
25.0000 ug | INTRAMUSCULAR | Status: DC | PRN
  Administered 2020-08-03: 25 ug via INTRAVENOUS

## 2020-08-03 SURGICAL SUPPLY — 75 items
ADH SKN CLS APL DERMABOND .7 (GAUZE/BANDAGES/DRESSINGS) ×1
ANCHOR TIS RET SYS 235ML (MISCELLANEOUS) ×2 IMPLANT
APL PRP STRL LF DISP 70% ISPRP (MISCELLANEOUS) ×1
BAG INFUSER PRESSURE 100CC (MISCELLANEOUS) IMPLANT
BAG SPEC RTRVL LRG 6X4 10 (ENDOMECHANICALS) ×1
BAG TISS RTRVL C235 10X14 (MISCELLANEOUS) ×1
BLADE SURG SZ11 CARB STEEL (BLADE) ×2 IMPLANT
CANISTER SUCT 1200ML W/VALVE (MISCELLANEOUS) ×2 IMPLANT
CANNULA REDUC XI 12-8 STAPL (CANNULA) ×2
CANNULA REDUCER 12-8 DVNC XI (CANNULA) ×1 IMPLANT
CHLORAPREP W/TINT 26 (MISCELLANEOUS) ×2 IMPLANT
COVER TIP SHEARS 8 DVNC (MISCELLANEOUS) ×1 IMPLANT
COVER TIP SHEARS 8MM DA VINCI (MISCELLANEOUS) ×2
COVER WAND RF STERILE (DRAPES) IMPLANT
DEFOGGER SCOPE WARMER CLEARIFY (MISCELLANEOUS) ×2 IMPLANT
DERMABOND ADVANCED (GAUZE/BANDAGES/DRESSINGS) ×1
DERMABOND ADVANCED .7 DNX12 (GAUZE/BANDAGES/DRESSINGS) ×1 IMPLANT
DRAPE ARM DVNC X/XI (DISPOSABLE) ×4 IMPLANT
DRAPE COLUMN DVNC XI (DISPOSABLE) ×1 IMPLANT
DRAPE DA VINCI XI ARM (DISPOSABLE) ×8
DRAPE DA VINCI XI COLUMN (DISPOSABLE) ×2
ELECT CAUTERY BLADE 6.4 (BLADE) IMPLANT
ELECT REM PT RETURN 9FT ADLT (ELECTROSURGICAL) ×2
ELECTRODE REM PT RTRN 9FT ADLT (ELECTROSURGICAL) ×1 IMPLANT
GLOVE BIOGEL PI IND STRL 7.0 (GLOVE) ×2 IMPLANT
GLOVE BIOGEL PI INDICATOR 7.0 (GLOVE) ×2
GLOVE SURG SYN 6.5 ES PF (GLOVE) ×4 IMPLANT
GLOVE SURG SYN 6.5 PF PI (GLOVE) ×2 IMPLANT
GOWN STRL REUS W/ TWL LRG LVL3 (GOWN DISPOSABLE) ×3 IMPLANT
GOWN STRL REUS W/TWL LRG LVL3 (GOWN DISPOSABLE) ×6
GRASPER SUT TROCAR 14GX15 (MISCELLANEOUS) IMPLANT
IRRIGATOR SUCT 8 DISP DVNC XI (IRRIGATION / IRRIGATOR) IMPLANT
IRRIGATOR SUCTION 8MM XI DISP (IRRIGATION / IRRIGATOR)
IV NS 1000ML (IV SOLUTION)
IV NS 1000ML BAXH (IV SOLUTION) IMPLANT
KIT PINK PAD W/HEAD ARE REST (MISCELLANEOUS) ×2
KIT PINK PAD W/HEAD ARM REST (MISCELLANEOUS) ×1 IMPLANT
LABEL OR SOLS (LABEL) IMPLANT
NEEDLE HYPO 22GX1.5 SAFETY (NEEDLE) ×2 IMPLANT
NEEDLE INSUFFLATION 14GA 120MM (NEEDLE) ×2 IMPLANT
OBTURATOR OPTICAL STANDARD 8MM (TROCAR) ×2
OBTURATOR OPTICAL STND 8 DVNC (TROCAR) ×1
OBTURATOR OPTICALSTD 8 DVNC (TROCAR) ×1 IMPLANT
PACK LAP CHOLECYSTECTOMY (MISCELLANEOUS) ×2 IMPLANT
PENCIL ELECTRO HAND CTR (MISCELLANEOUS) ×2 IMPLANT
POUCH SPECIMEN RETRIEVAL 10MM (ENDOMECHANICALS) ×2 IMPLANT
RELOAD STAPLE 45 3.5 BLU DVNC (STAPLE) ×1 IMPLANT
RELOAD STAPLER 2.5X45 WHT DVNC (STAPLE) IMPLANT
RELOAD STAPLER 3.5X45 BLU DVNC (STAPLE) ×1 IMPLANT
SEAL CANN UNIV 5-8 DVNC XI (MISCELLANEOUS) ×3 IMPLANT
SEAL XI 5MM-8MM UNIVERSAL (MISCELLANEOUS) ×6
SEALER VESSEL DA VINCI XI (MISCELLANEOUS) ×2
SEALER VESSEL EXT DVNC XI (MISCELLANEOUS) IMPLANT
SET TUBE SMOKE EVAC HIGH FLOW (TUBING) ×2 IMPLANT
SOLUTION ELECTROLUBE (MISCELLANEOUS) ×2 IMPLANT
STAPLER 45 DA VINCI SURE FORM (STAPLE) ×2
STAPLER 45 SUREFORM DVNC (STAPLE) ×1 IMPLANT
STAPLER CANNULA SEAL DVNC XI (STAPLE) ×1 IMPLANT
STAPLER CANNULA SEAL XI (STAPLE) ×2
STAPLER RELOAD 2.5X45 WHITE (STAPLE)
STAPLER RELOAD 2.5X45 WHT DVNC (STAPLE)
STAPLER RELOAD 3.5X45 BLU DVNC (STAPLE) ×1
STAPLER RELOAD 3.5X45 BLUE (STAPLE) ×2
SUT MNCRL 4-0 (SUTURE) ×2
SUT MNCRL 4-0 27XMFL (SUTURE) ×1
SUT MNCRL AB 4-0 PS2 18 (SUTURE) ×2 IMPLANT
SUT VIC AB 2-0 CT1 36 (SUTURE) ×1 IMPLANT
SUT VIC AB 3-0 SH 27 (SUTURE) ×2
SUT VIC AB 3-0 SH 27X BRD (SUTURE) ×1 IMPLANT
SUT VICRYL 0 AB UR-6 (SUTURE) ×2 IMPLANT
SUT VLOC 90 6 CV-15 VIOLET (SUTURE) ×2 IMPLANT
SUTURE MNCRL 4-0 27XMF (SUTURE) IMPLANT
SYR 30ML LL (SYRINGE) ×2 IMPLANT
SYSTEM WECK SHIELD CLOSURE (TROCAR) IMPLANT
TRAY FOLEY MTR SLVR 16FR STAT (SET/KITS/TRAYS/PACK) ×2 IMPLANT

## 2020-08-03 NOTE — ED Notes (Signed)
Unsuccessful IV attempt x1  Pt reports feeling nauseous and lightheaded, wants to wait a few minutes before trying again

## 2020-08-03 NOTE — H&P (Signed)
SURGICAL CONSULTATION NOTE   HISTORY OF PRESENT ILLNESS (HPI):  44 y.o. female presented to [facility] ED for evaluation of abdominal pain since yesterday.  Patient actually reported that she had a similar pain last Friday.  The pain went away and yesterday she had another pain in the right upper quadrant.  She reported that she was feeling well during the whole week.  Pain localized to the right lower quadrant.  Pain does not radiate to the probably body.  Pain is aggravated by applying pressure.  There is no alleviating factors.  Patient reported nausea but no vomiting.  Patient denies fever.    At the ED she was found with mild leukocytosis.  CT scan of the abdomen and pelvis shows significant fat stranding around the appendix with thickness of the appendiceal wall.  There is no free air or free fluid.  I personally evaluated the images.  Surgery is consulted by Dr. Larinda Buttery in this context for evaluation and management of acute appendicitis.  PAST MEDICAL HISTORY (PMH):  Past Medical History:  Diagnosis Date  . Depressive disorder, not elsewhere classified   . Elevated blood pressure reading without diagnosis of hypertension   . Migraine without aura, without mention of intractable migraine without mention of status migrainosus   . Pure hypercholesterolemia   . Unspecified urinary incontinence      PAST SURGICAL HISTORY (PSH):  Past Surgical History:  Procedure Laterality Date  . ABDOMINAL HYSTERECTOMY    . OOPHORECTOMY    . TONSILLECTOMY AND ADENOIDECTOMY       MEDICATIONS:  Prior to Admission medications   Medication Sig Start Date End Date Taking? Authorizing Provider  eletriptan (RELPAX) 40 MG tablet TAKE ONE TABLET AS NEEDED FOR MIGRAINE OR HEADACHE. MAY REPEAT IN 2 HOURS IF HEADACHE PERSISTS OR RECURS 07/30/20   Ermalene Searing, Amy E, MD  famotidine (PEPCID) 20 MG tablet Take 1 tablet (20 mg total) by mouth 2 (two) times daily. 07/27/20   Khatri, Hina, PA-C  metoprolol succinate  (TOPROL-XL) 25 MG 24 hr tablet Take 1 tablet (25 mg total) by mouth daily. 08/02/20   Bedsole, Amy E, MD  metroNIDAZOLE (METROGEL) 0.75 % gel Apply 1 application topically daily. Applies to face    [provider]  mometasone (NASONEX) 50 MCG/ACT nasal spray USE 2 SPRAYS IN EACH NOSTRIL ONCE DAILY AS DIRECTED 12/19/19   Bedsole, Amy E, MD  omeprazole (PRILOSEC) 20 MG capsule Take 20 mg by mouth daily.    [provider]  SUMAtriptan (IMITREX) 50 MG tablet TAKE ONE TABLET EVERY 2 HOURS AS NEEDED FOR MIGRAINE 07/15/19   Bedsole, Amy E, MD  venlafaxine XR (EFFEXOR XR) 75 MG 24 hr capsule Take 1 capsule (75 mg total) by mouth daily with breakfast. 07/06/20   Ermalene Searing, Amy E, MD     ALLERGIES:  Allergies  Allergen Reactions  . Eggs Or Egg-Derived Products   . Milk-Related Compounds   . Peanut-Containing Drug Products      SOCIAL HISTORY:  Social History   Socioeconomic History  . Marital status: Married    Spouse name: Not on file  . Number of children: Not on file  . Years of education: Not on file  . Highest education level: Not on file  Occupational History  . 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 Smoking status: Never Smoker  . Smokeless tobacco: Never Used  Vaping Use  . Vaping Use: Never used  Substance and Sexual Activity  . Alcohol use: No  . Drug use: No  . Sexual activity: Not on file  Other Topics Concern  . Not on file  Social History Narrative   Regular exercise-yes 3 days a week   Diet: healthy   Social Determinants of Health   Financial Resource Strain:   . Difficulty of Paying Living Expenses: Not on file  Food Insecurity:   . Worried About Programme researcher, broadcasting/film/video in the Last Year: Not on file  . Ran Out of Food in the Last Year: Not on file  Transportation Needs:   . Lack of Transportation (Medical): Not on file  . Lack of Transportation (Non-Medical): Not on file  Physical Activity:   . Days of  Exercise per Week: Not on file  . Minutes of Exercise per Session: Not on file  Stress:   . Feeling of Stress : Not on file  Social Connections:   . Frequency of Communication with Friends and Family: Not on file  . Frequency of Social Gatherings with Friends and Family: Not on file  . Attends Religious Services: Not on file  . Active Member of Clubs or Organizations: Not on file  . Attends Banker Meetings: Not on file  . Marital Status: Not on file  Intimate Partner Violence:   . Fear of Current or Ex-Partner: Not on file  . Emotionally Abused: Not on file  . Physically Abused: Not on file  . Sexually Abused: Not on file      FAMILY HISTORY:  Family History  Problem Relation Age of Onset  . Aneurysm Father   . Depression Mother   . Lupus Mother   . Sjogren's syndrome Mother   . Breast cancer Maternal Aunt      REVIEW OF SYSTEMS:  Constitutional: denies weight loss, fever, chills, or sweats  Eyes: denies any other vision changes, history of eye injury  ENT: denies sore throat, hearing problems  Respiratory: denies shortness of breath, wheezing  Cardiovascular: denies chest pain, palpitations  Gastrointestinal: positive abdominal pain, nausea.  Negative for vomiting Genitourinary: denies burning with urination or urinary frequency Musculoskeletal: denies any other joint pains or cramps  Skin: denies any other rashes or skin discolorations  Neurological: denies any other headache, dizziness, weakness  Psychiatric: denies any other depression, anxiety   All other review of systems were negative   VITAL SIGNS:  Temp:  [98.1 F (36.7 C)-98.6 F (37 C)] 98.6 F (37 C) (10/01 0230) Pulse Rate:  [71-92] 85 (10/01 0435) Resp:  [18-19] 19 (10/01 0435) BP: (130-145)/(93-101) 139/101 (10/01 0435) SpO2:  [96 %-100 %] 96 % (10/01 0435) Weight:  [102.1 kg-103.3 kg] 102.1 kg (09/30 1835)     Height: 5' 6.5" (168.9 cm) Weight: 102.1 kg BMI (Calculated): 35.78    INTAKE/OUTPUT:  This shift: No intake/output data recorded.  Last 2 shifts: @IOLAST2SHIFTS @   PHYSICAL EXAM:  Constitutional:  -- Normal body habitus  -- Awake, alert, and oriented x3  Eyes:  -- Pupils equally round and reactive to light  -- No scleral icterus  Ear, nose, and throat:  -- No jugular venous distension  Pulmonary:  -- No crackles  -- Equal breath sounds bilaterally -- Breathing non-labored at rest Cardiovascular:  -- S1, S2 present  -- No pericardial rubs Gastrointestinal:  -- Abdomen soft, nontender, non-distended, no guarding or rebound tenderness -- No abdominal masses appreciated, pulsatile or otherwise  Musculoskeletal and Integumentary:  -- Wounds or skin discoloration:  None appreciated -- Extremities: B/L UE and LE FROM, hands and feet warm, no edema  Neurologic:  -- Motor function: intact and symmetric -- Sensation: intact and symmetric   Labs:  CBC Latest Ref Rng & Units 08/02/2020 07/27/2020 06/15/2018  WBC 4.0 - 10.5 K/uL 11.9(H) 7.6 7.0  Hemoglobin 12.0 - 15.0 g/dL 15.7(H) 15.1(H) 15.1(H)  Hematocrit 36 - 46 % 44.9 45.6 44.8  Platelets 150 - 400 K/uL 367 321 320.0   CMP Latest Ref Rng & Units 08/02/2020 07/27/2020 07/17/2020  Glucose 70 - 99 mg/dL 295(A) 98 88  BUN 6 - 20 mg/dL 12 10 18   Creatinine 0.44 - 1.00 mg/dL ) 2.13(Y) 8.65(H  Sodium 135 - 145 mmol/L 139 141 136  Potassium 3.5 - 5.1 mmol/L 4.7 4.7 4.6  Chloride 98 - 111 mmol/L 101 103 101  CO2 22 - 32 mmol/L 25 28 26   Calcium 8.9 - 10.3 mg/dL 9.7 9.7 9.8  Total Protein 6.5 - 8.1 g/dL 8.0 7.9 7.2  Total Bilirubin 0.3 - 1.2 mg/dL 0.9 0.4 0.5  Alkaline Phos 38 - 126 U/L 72 71 64  AST 15 - 41 U/L 24 25 23   ALT 0 - 44 U/L 29 29 23     Imaging studies:  EXAM: CT ABDOMEN AND PELVIS WITH CONTRAST  TECHNIQUE: Multidetector CT imaging of the abdomen and pelvis was performed using the standard protocol following bolus administration of intravenous contrast.  CONTRAST:  8.46  OMNIPAQUE IOHEXOL 300 MG/ML  SOLN  COMPARISON:  Ultrasound abdomen 07/27/2020  FINDINGS: Lower chest: Dependent atelectasis in the lung bases. Small esophageal hiatal hernia.  Hepatobiliary: No focal liver abnormality is seen. No gallstones, gallbladder wall thickening, or biliary dilatation.  Pancreas: Unremarkable. No pancreatic ductal dilatation or surrounding inflammatory changes.  Spleen: Normal in size without focal abnormality.  Adrenals/Urinary Tract: Adrenal glands are unremarkable. Kidneys are normal, without renal calculi, focal lesion, or hydronephrosis. Bladder is unremarkable.  Stomach/Bowel: Stomach, small bowel, and colon are not abnormally distended. Contrast material flows to the rectum without evidence of obstruction. The tip of the appendix is distended and thick walled with diameter measuring up to 1.5 cm. There is periappendiceal stranding mostly around the tip of the appendix. No abscess. This is consistent with tip appendicitis.  Appendix: Location: Retrocecal  Diameter: 15 mm  Appendicolith: No  Mucosal hyper-enhancement: No  Extraluminal gas: No  Periappendiceal collection: No collection. Prominent periappendiceal stranding.  Vascular/Lymphatic: No significant vascular findings are present. No enlarged abdominal or pelvic lymph nodes.  Reproductive: Status post hysterectomy. No adnexal masses.  Other: No free air or free fluid in the abdomen. Abdominal wall musculature appears intact.  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 No destructive bone lesions.  IMPRESSION: 1. Findings consistent with tip appendicitis. No abscess. 2. Small esophageal hiatal hernia. 3. 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   Electronically Signed   By: M.D.   On: 08/03/2020 03:53  Assessment/Plan:  44 y.o. female with , complicated by pertinent comorbidities including  migraine, hypercholesterolemia, depressive disorder.  Patient with history, physical exam and images consistent with acute appendicitis. Patient oriented about diagnosis and surgical management as treatment. Patient oriented about goals of surgery and its risk including: bowel injury, infection, abscess, bleeding, leak from cecum, intestinal adhesions, bowel obstruction, fistula, injury to the ureter among others.  Patient understood and agreed to proceed with surgery. Will admit patient, already started on antibiotic therapy, will give IV hydration since patient is NPO and schedule to OR.  Arnold Long, MD

## 2020-08-03 NOTE — Interval H&P Note (Signed)
History and Physical Interval Note:  08/03/2020 11:11 AM  Mary Keith  has presented today for surgery, with the diagnosis of Acute Appendicitis.  The various methods of treatment have been discussed with the patient and family. After consideration of risks, benefits and other options for treatment, the patient has consented to  Procedure(s): XI ROBOTIC LAPAROSCOPIC ASSISTED APPENDECTOMY (N/A) as a surgical intervention.  The patient's history has been reviewed, patient examined, no change in status, stable for surgery.  I have reviewed the patient's chart and labs.  Questions were answered to the patient's satisfaction.     Carolan Shiver

## 2020-08-03 NOTE — Anesthesia Preprocedure Evaluation (Signed)
Anesthesia Evaluation  Patient identified by MRN, date of birth, ID band Patient awake    Reviewed: Allergy & Precautions, H&P , NPO status , Patient's Chart, lab work & pertinent test results, reviewed documented beta blocker date and time   History of Anesthesia Complications Negative for: history of anesthetic complications  Airway Mallampati: I  TM Distance: >3 FB Neck ROM: full    Dental  (+) Dental Advidsory Given, Teeth Intact   Pulmonary neg shortness of breath, asthma (as a child) , neg sleep apnea, neg recent URI,    Pulmonary exam normal breath sounds clear to auscultation       Cardiovascular Exercise Tolerance: Good hypertension, (-) angina(-) Past MI and (-) Cardiac Stents Normal cardiovascular exam(-) dysrhythmias (-) Valvular Problems/Murmurs Rhythm:regular Rate:Normal     Neuro/Psych  Headaches, neg Seizures PSYCHIATRIC DISORDERS Anxiety Depression    GI/Hepatic Neg liver ROS, GERD  ,  Endo/Other  negative endocrine ROS  Renal/GU negative Renal ROS  negative genitourinary   Musculoskeletal   Abdominal   Peds  Hematology negative hematology ROS (+)   Anesthesia Other Findings Past Medical History: No date: Depressive disorder, not elsewhere classified No date: Elevated blood pressure reading without diagnosis of  hypertension No date: Hypertension No date: Migraine without aura, without mention of intractable  migraine without mention of status migrainosus No date: Pure hypercholesterolemia No date: Unspecified urinary incontinence   Reproductive/Obstetrics negative OB ROS                             Anesthesia Physical Anesthesia Plan  ASA: II  Anesthesia Plan: General   Post-op Pain Management:    Induction: Intravenous, Rapid sequence and Cricoid pressure planned  PONV Risk Score and Plan: Ondansetron, Dexamethasone, Midazolam, Promethazine and Treatment may  vary due to age or medical condition  Airway Management Planned: Oral ETT  Additional Equipment:   Intra-op Plan:   Post-operative Plan: Extubation in OR  Informed Consent: I have reviewed the patients History and Physical, chart, labs and discussed the procedure including the risks, benefits and alternatives for the proposed anesthesia with the patient or authorized representative who has indicated his/her understanding and acceptance.     Dental Advisory Given  Plan Discussed with: Anesthesiologist, CRNA and Surgeon  Anesthesia Plan Comments:         Anesthesia Quick Evaluation

## 2020-08-03 NOTE — Telephone Encounter (Signed)
P[place] is currently admitted with acute appendicitis.

## 2020-08-03 NOTE — Transfer of Care (Signed)
Immediate Anesthesia Transfer of Care Note  Patient: Mary Keith  Procedure(s) Performed: XI ROBOTIC LAPAROSCOPIC ASSISTED APPENDECTOMY (N/A Abdomen)  Patient Location: PACU  Anesthesia Type:General  Level of Consciousness: awake, oriented, drowsy and patient cooperative  Airway & Oxygen Therapy: Patient Spontanous Breathing and Patient connected to face mask oxygen  Post-op Assessment: Report given to RN and Post -op Vital signs reviewed and stable  Post vital signs: Reviewed and stable  Last Vitals:  Vitals Value Taken Time  BP 130/82 08/03/20 1331  Temp 36.3 C 08/03/20 1329  Pulse 103 08/03/20 1338  Resp 18 08/03/20 1338  SpO2 86 % 08/03/20 1338  Vitals shown include unvalidated device data.  Last Pain:  Vitals:   08/03/20 1329  TempSrc:   PainSc: 0-No pain         Complications: No complications documented.

## 2020-08-03 NOTE — ED Provider Notes (Signed)
[facility] Emergency Department Provider Note   ____________________________________________   First MD Initiated Contact with Patient 08/03/20 [phone]     (approximate)  I have reviewed the triage vital signs and the nursing notes.   HISTORY  Chief Complaint Abdominal Pain    HPI Mary Keith is a 44 y.o. female with past medical history of hypertension, hyperlipidemia, and migraines who presents to the ED complaining of abdominal pain.  Patient reports that she initially developed pain in the right upper quadrant of her abdomen almost 1 week ago.  She was evaluated for this at the [facility], ER, where a right upper quadrant ultrasound was performed and negative for acute process.  She states that her pain subsequently resolved but then she developed similar pain in the right lower quadrant of her abdomen 2 days ago.  Pain has gradually worsened since onset and has been associated with nausea.  She denies any vomiting, diarrhea, dysuria, or hematuria.  She was seen by her PCP yesterday and referred to the ED for further evaluation for potential appendicitis.        Past Medical History:  Diagnosis Date  . Depressive disorder, not elsewhere classified   . Elevated blood pressure reading without diagnosis of hypertension   . Migraine without aura, without mention of intractable migraine without mention of status migrainosus   . Pure hypercholesterolemia   . Unspecified urinary incontinence     Patient Active Problem List   Diagnosis Date Noted  . Abdominal pain, RLQ 08/02/2020  . 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 Encounter for monitoring estrogen replacement therapy following surgical menopause 07/07/2019  . 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 Rosacea 10/03/2016  . Major depressive disorder, recurrent episode, moderate (HCC) 10/03/2016  . Generalized anxiety disorder 10/03/2016  . Lumbar pain with radiation down  right leg 05/13/2016  . 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 HYPERCHOLESTEROLEMIA 08/09/2010  . Migraine with aura and without status migrainosus, not intractable 08/09/2010  . Mild intermittent asthma without complication 08/09/2010  . GASTROESOPHAGEAL REFLUX DISEASE 08/09/2010  . URINARY INCONTINENCE 08/09/2010  . HTN (hypertension) 08/09/2010    Past Surgical History:  Procedure Laterality Date  . ABDOMINAL HYSTERECTOMY    . OOPHORECTOMY    . TONSILLECTOMY AND ADENOIDECTOMY      Prior to Admission medications   Medication Sig Start Date End Date Taking? Authorizing Provider  eletriptan (RELPAX) 40 MG tablet TAKE ONE TABLET AS NEEDED FOR MIGRAINE OR HEADACHE. MAY REPEAT IN 2 HOURS IF HEADACHE PERSISTS OR RECURS 07/30/20   Ermalene Searing, Amy E, MD  famotidine (PEPCID) 20 MG tablet Take 1 tablet (20 mg total) by mouth 2 (two) times daily. 07/27/20   Khatri, Hina, PA-C  metoprolol succinate (TOPROL-XL) 25 MG 24 hr tablet Take 1 tablet (25 mg total) by mouth daily. 08/02/20   Bedsole, Amy E, MD  metroNIDAZOLE (METROGEL) 0.75 % gel Apply 1 application topically daily. Applies to face    [provider]  mometasone (NASONEX) 50 MCG/ACT nasal spray USE 2 SPRAYS IN EACH NOSTRIL ONCE DAILY AS DIRECTED 12/19/19   Bedsole, Amy E, MD  omeprazole (PRILOSEC) 20 MG capsule Take 20 mg by mouth daily.    [provider]  SUMAtriptan (IMITREX) 50 MG tablet TAKE ONE TABLET EVERY 2 HOURS AS NEEDED FOR MIGRAINE 07/15/19   Bedsole, Amy E, MD  venlafaxine XR (EFFEXOR XR) 75 MG 24 hr capsule Take 1 capsule (75 mg  total) by mouth daily with breakfast. 07/06/20   Bedsole, Amy E, MD    Allergies Eggs or egg-derived products, Milk-related compounds, and Peanut-containing drug products  Family History  Problem Relation Age of Onset  . Aneurysm Father   . Depression Mother   . Lupus Mother   . Sjogren's syndrome Mother   . Breast cancer Maternal Aunt     Social History Social History    Tobacco Use  . Smoking status: Never Smoker  . Smokeless tobacco: Never Used  Vaping Use  . Vaping Use: Never used  Substance Use Topics  . Alcohol use: No  . Drug use: No    Review of Systems  Constitutional: No fever/chills Eyes: No visual changes. ENT: No sore throat. Cardiovascular: Denies chest pain. Respiratory: Denies shortness of breath. Gastrointestinal: Positive for abdominal pain and nausea, no vomiting.  No diarrhea.  No constipation. Genitourinary: Negative for dysuria. Musculoskeletal: Negative for back pain. Skin: Negative for rash. Neurological: Negative for headaches, focal weakness or numbness.  ____________________________________________   PHYSICAL EXAM:  VITAL SIGNS: ED Triage Vitals  Enc Vitals Group     BP 08/02/20 1831 (!) 139/101     Pulse Rate 08/02/20 1831 92     Resp 08/02/20 1831 18     Temp 08/02/20 1831 98.6 F (37 C)     Temp Source 08/02/20 1831 Oral     SpO2 08/02/20 1831 99 %     Weight 08/02/20 1835 225 lb (102.1 kg)     Height 08/02/20 1835 5' 6.5" (1.689 m)     Head Circumference --      Peak Flow --      Pain Score 08/02/20 1833 8     Pain Loc --      Pain Edu? --      Excl. in GC? --     Constitutional: Alert and oriented. Eyes: Conjunctivae are normal. Head: Atraumatic. Nose: No congestion/rhinnorhea. Mouth/Throat: Mucous membranes are moist. Neck: Normal ROM Cardiovascular: Normal rate, regular rhythm. Grossly normal heart sounds. Respiratory: Normal respiratory effort.  No retractions. Lungs CTAB. Gastrointestinal: Soft and tender to palpation in the right upper quadrant and right lower quadrant with no rebound or guarding. No distention. Genitourinary: deferred Musculoskeletal: No lower extremity tenderness nor edema. Neurologic:  Normal speech and language. No gross focal neurologic deficits are appreciated. Skin:  Skin is warm, dry and intact. No rash noted. Psychiatric: Mood and affect are normal. Speech  and behavior are normal.  ____________________________________________   LABS (all labs ordered are listed, but only abnormal results are displayed)  Labs Reviewed  COMPREHENSIVE METABOLIC PANEL - Abnormal; Notable for the following components:      Result Value   Glucose, Bld 102 (*)    Creatinine, Ser 1.07 (*)    All other components within normal limits  CBC - Abnormal; Notable for the following components:   WBC 11.9 (*)    Hemoglobin 15.7 (*)    All other components within normal limits  URINALYSIS, COMPLETE (UACMP) WITH MICROSCOPIC - Abnormal; Notable for the following components:   Color, Urine STRAW (*)    APPearance CLEAR (*)    Specific Gravity, Urine 1.003 (*)    Hgb urine dipstick SMALL (*)    Ketones, ur 5 (*)    All other components within normal limits  RESPIRATORY PANEL BY RT PCR (FLU A&B, COVID)  LIPASE, BLOOD    PROCEDURES  Procedure(s) performed (including Critical Care):  Procedures   ____________________________________________  INITIAL IMPRESSION / ASSESSMENT AND PLAN / ED COURSE       44 year old female with past medical history of hypertension and hyperlipidemia who presents to the ED with 2 days of right lower quadrant abdominal pain after dealing with right upper quadrant abdominal pain earlier in the week.  Pain is reproduced with palpation of the right lower quadrant of her abdomen and CT scan is consistent with tip appendicitis.  Lab work shows mild leukocytosis but is otherwise unremarkable.  We will treat patient with IV morphine and Zofran, also hydrate with IV fluids and give initial dose of Zosyn.  Case discussed with Dr. Maia Plan of general surgery, will evaluate the patient later this morning.      ____________________________________________   FINAL CLINICAL IMPRESSION(S) / ED DIAGNOSES  Final diagnoses:  Acute appendicitis, unspecified acute appendicitis type     ED Discharge Orders    None       Note:  This document  was prepared using Dragon voice recognition software and may include unintentional dictation errors.   Chesley Noon, MD 08/03/20 [phone]

## 2020-08-03 NOTE — ED Notes (Signed)
Assumed care of pt upon being roomed. States 9/10 RLQ pain that radiated to umbilicus with movement. States she still has her appendix. AO x4. Pt provided with warm balnker. Talking in full sentences with regular and unlabored breathing. Call bell within reach, bed lowered. Husband at bedside

## 2020-08-03 NOTE — Op Note (Signed)
Pre-op Diagnosis: Acute appendicitis   Post op Diagnosis: Acute appenditicis  Procedure: Robotic assisted laparoscopic appendectomy.  Anesthesia: GETA  Surgeon: Carolan Shiver, MD, FACS  Wound Classification: clean contaminated  Specimen: Appendix  Complications: None  Estimated Blood Loss: 3 mL   Indications: Patient is a 44 y.o. female  presented with above right lower quadrant pain. CT scan shows acute appendicitis.     FIndings: 1.  Irritated appendix with periappendiceal serous fluid 2. No peri-appendiceal abscess or phlegmon 3. Normal anatomy 4. Adequate hemostasis.   Description of procedure: The patient was placed on the operating table in the supine position. General anesthesia was induced. A time-out was completed verifying correct patient, procedure, site, positioning, and implant(s) and/or special equipment prior to beginning this procedure. The abdomen was prepped and draped in the usual sterile fashion.   Palmer's point located and Veress needle was inserted.  After confirming 2 clicks and a positive saline drop test, gas insufflation was initiated until the abdominal pressure was measured at 15 mmHg.  Afterwards, the Veress needle was removed and a 8 mm port was placed in supra umbilical area using Optiview technique.  After local was infused, 2 additional incision was made 8 cm apart along the left side of the abdominal wall from the initial incision.  An 12 mm port was placed at the left lower quadrant port under direct visualization.  Camera was switched over to this new port and the right upper quadrant incision was extended yet again to 8 mm port was placed under direct visualization.  Final 8 mm port that was previously in the Palmer's point was then placed in the middle 8 mm port site.  No injuries from trocar placements were noted. The table was placed in the Trendelenburg position with the right side elevated.  With the use of Tip up grasper, Force Bipolar  and Vessel sealer, an inflamed appendix was identified and elevated.  Window created at base of appendix in the mesentery.   The base of the appendix was doubly ligated with 2-0 Vicryl and the appendix was divided with vessel sealer. Mesoappendix was divided with Vessel sealer. The appendix was placed in an endoscopic retrieval bag and removed.   The appendiceal stump was examined and hemostasis noted. A purse string was done to invert the appendiceal stump. No other pathology was identified within pelvis. The 12 mm trocar removed and port site closed with PMI using 0 vicryl under direct vision. Remaining trocars were removed under direct vision. No bleeding was noted.The abdomen was allowed to collapse.  All skin incisions then closed with subcuticular sutures Monocryl 4-0.  Wounds then dressed with dermabond.  The patient tolerated the procedure well, awakened from anesthesia and was taken to the postanesthesia care unit in satisfactory condition.  Sponge count and instrument count correct at the end of the procedure.

## 2020-08-03 NOTE — Telephone Encounter (Signed)
Noted  

## 2020-08-03 NOTE — Anesthesia Procedure Notes (Signed)
Procedure Name: Intubation Performed by: Mohammed Kindle, CRNA Pre-anesthesia Checklist: Patient identified, Emergency Drugs available, Suction available and Patient being monitored Patient Re-evaluated:Patient Re-evaluated prior to induction Oxygen Delivery Method: Circle system utilized Preoxygenation: Pre-oxygenation with 100% oxygen Induction Type: IV induction, Rapid sequence and Cricoid Pressure applied Laryngoscope Size: McGraph and 3 Grade View: Grade I Tube type: Oral Tube size: 7.0 mm Number of attempts: 1 Airway Equipment and Method: Stylet and Oral airway Placement Confirmation: ETT inserted through vocal cords under direct vision,  positive ETCO2,  breath sounds checked- equal and bilateral and CO2 detector Secured at: 21 cm Tube secured with: Tape Dental Injury: Teeth and Oropharynx as per pre-operative assessment

## 2020-08-03 NOTE — ED Notes (Signed)
Pt ambulated to toilet with minimal assistance. Denies pain at this time.

## 2020-08-04 MED ORDER — HYDROCODONE-ACETAMINOPHEN 5-325 MG PO TABS
1.0000 | ORAL_TABLET | ORAL | 0 refills | Status: AC | PRN
Start: 2020-08-04 — End: 2020-08-07

## 2020-08-04 MED ORDER — HYDROCODONE-ACETAMINOPHEN 5-325 MG PO TABS
1.0000 | ORAL_TABLET | ORAL | 0 refills | Status: DC | PRN
Start: 2020-08-04 — End: 2020-08-04

## 2020-08-04 NOTE — Discharge Instructions (Signed)
  Diet: Resume home heart healthy regular diet.   Activity: No heavy lifting >20 pounds (children, pets, laundry, garbage) or strenuous activity for one week, but light activity and walking are encouraged. Do not drive or drink alcohol if taking narcotic pain medications.  Wound care: May shower with soapy water and pat dry (do not rub incisions), but no baths or submerging incision underwater until follow-up. (no swimming)   Medications: Resume all home medications. For mild to moderate pain: acetaminophen (Tylenol) or ibuprofen (if no kidney disease). Combining Tylenol with alcohol can substantially increase your risk of causing liver disease. Narcotic pain medications, if prescribed, can be used for severe pain, though may cause nausea, constipation, and drowsiness. Do not combine Tylenol and Norco within a 6 hour period as Norco contains Tylenol. If you do not need the narcotic pain medication, you do not need to fill the prescription.  Call office ([phone]) at any time if any questions, worsening pain, fevers/chills, bleeding, drainage from incision site, or other concerns.  

## 2020-08-04 NOTE — Plan of Care (Signed)
Discharge teaching completed with patient who is in stable condition. 

## 2020-08-04 NOTE — Discharge Summary (Signed)
°  Patient ID: Mary Keith MRN: [id number] DOB/AGE: 02/17/1976 44 y.o.  Admit date: 08/03/2020 Discharge date: 08/04/2020   Discharge Diagnoses:  Active Problems:   Acute appendicitis with localized peritonitis   Procedures: robotic assisted laparoscopic appendectomy   Hospital Course: Patient with acute appendicitis.  She underwent robotic appendectomy.  She tolerated the procedure well.  This morning patient has been ambulating.  Patient tolerating diet.  Patient with control pain.  Wounds are dry and clean.  Physical Exam Cardiovascular:     Rate and Rhythm: Normal rate and regular rhythm.  Pulmonary:     Effort: Pulmonary effort is normal.     Breath sounds: Normal breath sounds.  Abdominal:     General: Abdomen is flat. Bowel sounds are normal.     Palpations: Abdomen is soft.  Neurological:     Mental Status: She is alert and oriented to person, place, and time.      Consults: None  Disposition: Discharge disposition: 01-Home or Self Care       Discharge Instructions    Diet - low sodium heart healthy   Complete by: As directed    Increase activity slowly   Complete by: As directed      Allergies as of 08/04/2020      Reactions   Eggs Or Egg-derived Products    Milk-related Compounds    Peanut-containing Drug Products       Medication List    TAKE these medications   Alka-Seltzer Heartburn + Gas 750-80 MG Chew Generic drug: Calcium Carbonate-Simethicone Chew 2-3 tablets by mouth as needed.   eletriptan 40 MG tablet Commonly known as: RELPAX TAKE ONE TABLET AS NEEDED FOR MIGRAINE OR HEADACHE. MAY REPEAT IN 2 HOURS IF HEADACHE PERSISTS OR RECURS   famotidine 20 MG tablet Commonly known as: PEPCID Take 1 tablet (20 mg total) by mouth 2 (two) times daily.   Glucosamine 750 MG Tabs Take 750 mg by mouth daily.   HYDROcodone-acetaminophen 5-325 MG tablet Commonly known as: Norco Take 1 tablet by mouth every 4 (four) hours as needed for up to 3  days for moderate pain.   ibuprofen 200 MG tablet Commonly known as: ADVIL Take 600 mg by mouth every 8 (eight) hours as needed for headache.   metoprolol succinate 25 MG 24 hr tablet Commonly known as: TOPROL-XL Take 1 tablet (25 mg total) by mouth daily.   metroNIDAZOLE 0.75 % gel Commonly known as: METROGEL Apply 1 application topically daily. Applies to face   mometasone 50 MCG/ACT nasal spray Commonly known as: NASONEX USE 2 SPRAYS IN EACH NOSTRIL ONCE DAILY AS DIRECTED   omeprazole 20 MG capsule Commonly known as: PRILOSEC Take 20 mg by mouth daily.   SUMAtriptan 50 MG tablet Commonly known as: IMITREX TAKE ONE TABLET EVERY 2 HOURS AS NEEDED FOR MIGRAINE   venlafaxine XR 75 MG 24 hr capsule Commonly known as: Effexor XR Take 1 capsule (75 mg total) by mouth daily with breakfast.       Follow-up Information    Carolan Shiver, MD Follow up in 2 week(s).   Specialty: General Surgery Why: Follow up after appendectomy Contact information: [address] [place] [address] [phone]

## 2020-08-04 NOTE — Plan of Care (Signed)
Continuing with plan of care. 

## 2020-08-06 NOTE — Anesthesia Postprocedure Evaluation (Signed)
Anesthesia Post Note  Patient: Mary Keith  Procedure(s) Performed: XI ROBOTIC LAPAROSCOPIC ASSISTED APPENDECTOMY (N/A Abdomen)  Patient location during evaluation: PACU Anesthesia Type: General Level of consciousness: awake and alert Pain management: pain level controlled Vital Signs Assessment: post-procedure vital signs reviewed and stable Respiratory status: spontaneous breathing, nonlabored ventilation, respiratory function stable and patient connected to nasal cannula oxygen Cardiovascular status: blood pressure returned to baseline and stable Postop Assessment: no apparent nausea or vomiting Anesthetic complications: no   No complications documented.   Last Vitals:  Vitals:   08/04/20 1305 08/04/20 1419  BP: (!) 144/79 129/77  Pulse: 65 62  Resp: 15 16  Temp: 36.8 C 36.9 C  SpO2: 99% 96%    Last Pain:  Vitals:   08/04/20 1600  TempSrc:   PainSc: 6                  Lenard Simmer

## 2020-08-07 LAB — SURGICAL PATHOLOGY

## 2020-08-13 ENCOUNTER — Telehealth: Payer: Self-pay | Admitting: *Deleted

## 2020-08-13 MED ORDER — ELETRIPTAN HYDROBROMIDE 40 MG PO TABS: ORAL_TABLET | ORAL | 2 refills | Status: AC

## 2020-08-13 NOTE — Telephone Encounter (Signed)
Received fax from [facility] requesting PA on Eletriptan 40 mg.  Per PA completed in 09/2019, insurance will only cover 6 tablets per month.  Last refill was sent to #10.  New Rx sent to [facility] with change in quantity to #6.

## 2020-09-04 NOTE — Telephone Encounter (Signed)
Pt seen 07/06/20.

## 2020-10-11 ENCOUNTER — Other Ambulatory Visit: Payer: Self-pay | Admitting: Family Medicine

## 2021-08-07 ENCOUNTER — Other Ambulatory Visit: Payer: Self-pay | Admitting: Family Medicine

## 2021-08-07 NOTE — Telephone Encounter (Signed)
Patient overdue for CPE with Dr. Ermalene Searing. Please schedule
